# Patient Record
Sex: Male | Born: 1935 | Race: White | Hispanic: No | Marital: Married | Smoking: Former smoker
Health system: Southern US, Community
[De-identification: ages and names within clinical notes are randomized; demographics above are authoritative.]

## PROBLEM LIST (undated history)

## (undated) DIAGNOSIS — E785 Hyperlipidemia, unspecified: Secondary | ICD-10-CM

## (undated) DIAGNOSIS — I10 Essential (primary) hypertension: Secondary | ICD-10-CM

## (undated) HISTORY — PX: OTHER SURGICAL HISTORY: SHX169

---

## 2005-07-03 ENCOUNTER — Ambulatory Visit: Payer: Self-pay

## 2012-11-15 ENCOUNTER — Emergency Department (HOSPITAL_COMMUNITY)
Admission: EM | Admit: 2012-11-15 | Discharge: 2012-11-15 | Disposition: A | Discharge status: Home / Self Care | Payer: Medicare Other | Attending: Emergency Medicine | Admitting: Emergency Medicine

## 2012-11-15 ENCOUNTER — Encounter (HOSPITAL_COMMUNITY): Payer: Self-pay

## 2012-11-15 DIAGNOSIS — H538 Other visual disturbances: Secondary | ICD-10-CM | POA: Insufficient documentation

## 2012-11-15 DIAGNOSIS — R630 Anorexia: Secondary | ICD-10-CM | POA: Insufficient documentation

## 2012-11-15 DIAGNOSIS — Z789 Other specified health status: Secondary | ICD-10-CM

## 2012-11-15 DIAGNOSIS — R29898 Other symptoms and signs involving the musculoskeletal system: Secondary | ICD-10-CM | POA: Insufficient documentation

## 2012-11-15 DIAGNOSIS — Z79899 Other long term (current) drug therapy: Secondary | ICD-10-CM | POA: Insufficient documentation

## 2012-11-15 DIAGNOSIS — R5381 Other malaise: Secondary | ICD-10-CM | POA: Insufficient documentation

## 2012-11-15 DIAGNOSIS — H409 Unspecified glaucoma: Secondary | ICD-10-CM | POA: Insufficient documentation

## 2012-11-15 DIAGNOSIS — Z7409 Other reduced mobility: Secondary | ICD-10-CM

## 2012-11-15 DIAGNOSIS — R000 Tachycardia, unspecified: Secondary | ICD-10-CM | POA: Insufficient documentation

## 2012-11-15 DIAGNOSIS — R0682 Tachypnea, not elsewhere classified: Secondary | ICD-10-CM | POA: Insufficient documentation

## 2012-11-15 DIAGNOSIS — M25579 Pain in unspecified ankle and joints of unspecified foot: Secondary | ICD-10-CM | POA: Insufficient documentation

## 2012-11-15 DIAGNOSIS — N390 Urinary tract infection, site not specified: Secondary | ICD-10-CM | POA: Insufficient documentation

## 2012-11-15 LAB — URINALYSIS, ROUTINE W REFLEX MICROSCOPIC
Bilirubin Urine: NEGATIVE
Glucose, UA: NEGATIVE mg/dL
Hgb urine dipstick: NEGATIVE
Nitrite: NEGATIVE
Specific Gravity, Urine: 1.015 (ref 1.005–1.030)
Urobilinogen, UA: 0.2 mg/dL (ref 0.0–1.0)
pH: 6 (ref 5.0–8.0)

## 2012-11-15 LAB — MAGNESIUM: Magnesium: 1.9 mg/dL (ref 1.5–2.5)

## 2012-11-15 LAB — COMPREHENSIVE METABOLIC PANEL
ALT: 47 U/L (ref 0–53)
AST: 40 U/L — ABNORMAL HIGH (ref 0–37)
Albumin: 3.3 g/dL — ABNORMAL LOW (ref 3.5–5.2)
Alkaline Phosphatase: 102 U/L (ref 39–117)
BUN: 19 mg/dL (ref 6–23)
CO2: 24 mEq/L (ref 19–32)
Calcium: 10 mg/dL (ref 8.4–10.5)
Chloride: 91 mEq/L — ABNORMAL LOW (ref 96–112)
Creatinine, Ser: 1.01 mg/dL (ref 0.50–1.35)
GFR calc Af Amer: 76 mL/min — ABNORMAL LOW (ref >90)
GFR calc non Af Amer: 65 mL/min — ABNORMAL LOW (ref >90)
Glucose, Bld: 154 mg/dL — ABNORMAL HIGH (ref 70–99)
Potassium: 3.8 mEq/L (ref 3.5–5.1)
Sodium: 131 mEq/L — ABNORMAL LOW (ref 135–145)
Total Bilirubin: 0.6 mg/dL (ref 0.3–1.2)
Total Protein: 8.3 g/dL (ref 6.0–8.3)

## 2012-11-15 LAB — CBC WITH DIFFERENTIAL/PLATELET
Basophils Absolute: 0 10*3/uL (ref 0.0–0.1)
Basophils Relative: 1 % (ref 0–1)
Eosinophils Absolute: 0 10*3/uL (ref 0.0–0.7)
Eosinophils Relative: 0 % (ref 0–5)
HCT: 36.5 % — ABNORMAL LOW (ref 39.0–52.0)
Hemoglobin: 12.3 g/dL — ABNORMAL LOW (ref 13.0–17.0)
Lymphocytes Relative: 21 % (ref 12–46)
Lymphs Abs: 1.3 10*3/uL (ref 0.7–4.0)
MCH: 28.3 pg (ref 26.0–34.0)
MCHC: 33.7 g/dL (ref 30.0–36.0)
MCV: 83.9 fL (ref 78.0–100.0)
Monocytes Absolute: 0.5 10*3/uL (ref 0.1–1.0)
Monocytes Relative: 7 % (ref 3–12)
Neutro Abs: 4.5 10*3/uL (ref 1.7–7.7)
Neutrophils Relative %: 72 % (ref 43–77)
Platelets: 199 10*3/uL (ref 150–400)
RBC: 4.35 MIL/uL (ref 4.22–5.81)
RDW: 13.6 % (ref 11.5–15.5)
WBC: 6.3 10*3/uL (ref 4.0–10.5)

## 2012-11-15 LAB — URINE MICROSCOPIC-ADD ON

## 2012-11-15 LAB — TROPONIN I: Troponin I: <0.3 ng/mL (ref <0.30)

## 2012-11-15 MED ORDER — CEPHALEXIN 500 MG PO CAPS: 500.0000 mg | ORAL_CAPSULE | Freq: Four times a day (QID) | ORAL | Status: DC

## 2012-11-15 MED ORDER — DEXTROSE 5 % IV SOLN
1.0000 g | Freq: Once | INTRAVENOUS | Status: AC
  Administered 2012-11-15: 1 g via INTRAVENOUS
  Filled 2012-11-15: qty 10

## 2012-11-15 MED ORDER — SODIUM CHLORIDE 0.9 % IV BOLUS (SEPSIS)
1000.0000 mL | Freq: Once | INTRAVENOUS | Status: AC
  Administered 2012-11-15: 1000 mL via INTRAVENOUS

## 2012-11-15 NOTE — ED Notes (Signed)
[facility] to take pt home, fu with [employer] [facility] [place] [phone]

## 2012-11-15 NOTE — ED Notes (Signed)
Spoke with Cleotis Nipper from [facility].  Provided contact # for patient discharge:  [phone].  They will arrange to send someone to their home to do welfare check.

## 2012-11-15 NOTE — ED Notes (Signed)
Pt here from [facility] for a check up and either placement or help at home.

## 2012-11-15 NOTE — ED Provider Notes (Signed)
CSN: [id number]     Arrival date & time 11/15/12  1500 History  This chart was scribed for Jorge Razor, MD by Bennett Scrape, ED Scribe. This patient was seen in room APA03/APA03 and the patient's care was started at 4:19 PM.   Chief Complaint  Patient presents with  . multiple complaints     The history is provided by the patient. No language interpreter was used.    HPI Comments: Jorge Wilson is a 79 y.o. male who presents to the Emergency Department with his wife requesting help with placement in a home. Pt states that he had bilateral foot pain described as burning and was seen by [facility] on July 17th, 2014. He was started on the medication Gabapentin which improved the symptoms. However, he stopped taking the medications because of side effects. Since then, pt states that he has not been able to take of himself, has had a decreased appetite, worsening of chronic blurred vision from glaucoma and just feels "run down". He states that he doesn't have any energy to ambulate around his house. Pt states that he lives at home alone with his wife and is her the main caretaker. He states that he does all the cooking, drives and all the yard work but does not feel like he can continue completing all of these tasks at this time. Pt states that he has one son that feels he should find placement as well. He states that he has been seen at [facility] with the same complaints and received no help. He reports that he was having a nurse come daily more than 2 years ago but told her to not "come back" due to her saying he was wasting money. He denies having any other resources or in home checks since then.  PCP is Dr. Andres Shad with the [facility]  History reviewed. No pertinent past medical history. History reviewed. No pertinent past surgical history. No family history on file. History  Substance Use Topics  . Smoking status: Not on file  . Smokeless tobacco: Not on file   . Alcohol Use: Not on file    Review of Systems  Constitutional: Positive for appetite change and unexpected weight change.  Eyes: Positive for visual disturbance.  Respiratory: Negative for shortness of breath.   Cardiovascular: Negative for chest pain.  Gastrointestinal: Negative for abdominal pain.  Neurological: Positive for weakness.  All other systems reviewed and are negative.    Allergies  Review of patient's allergies indicates not on file.  Home Medications  No current outpatient prescriptions on file.  Triage Vitals: BP 127/72  Pulse 117  Temp(Src) 98.3 F (36.8 C) (Oral)  Resp 18  SpO2 100%  Physical Exam  Nursing note and vitals reviewed. Constitutional: He is oriented to person, place, and time. No distress.  frail and chronically ill appearing, but not distressed  HENT:  Head: Normocephalic and atraumatic.  Eyes: EOM are normal.  Neck: Neck supple. No tracheal deviation present.  Cardiovascular: Regular rhythm.   No murmur heard. tachycardic  Pulmonary/Chest: Breath sounds normal. No respiratory distress.  Tachypneic, speaking in complete sentences  Abdominal: Soft. There is no tenderness.  Musculoskeletal: Normal range of motion.  Neurological: He is alert and oriented to person, place, and time. No cranial nerve deficit. He exhibits normal muscle tone. Coordination normal.  Skin: Skin is warm and dry.  Psychiatric: He has a normal mood and affect. His behavior is normal.    ED  Course   DIAGNOSTIC STUDIES: Oxygen Saturation is 100% on room air, normal by my interpretation.    COORDINATION OF CARE: 4:25 PM-Advised pt that placement might not be achieved today. Discussed treatment plan which includes CBC panel, CMP and UA with pt at bedside and pt agreed to plan. Will have social work speak with the pt.   Procedures (including critical care time)  Labs Reviewed  CBC WITH DIFFERENTIAL - Abnormal; Notable for the following:    Hemoglobin 12.3  (*)    HCT 36.5 (*)    All other components within normal limits  COMPREHENSIVE METABOLIC PANEL - Abnormal; Notable for the following:    Sodium 131 (*)    Chloride 91 (*)    Glucose, Bld 154 (*)    Albumin 3.3 (*)    AST 40 (*)    GFR calc non Af Amer 65 (*)    GFR calc Af Amer 76 (*)    All other components within normal limits  URINALYSIS, ROUTINE W REFLEX MICROSCOPIC - Abnormal; Notable for the following:    Ketones, ur TRACE (*)    Protein, ur TRACE (*)    Leukocytes, UA TRACE (*)    All other components within normal limits  URINE MICROSCOPIC-ADD ON - Abnormal; Notable for the following:    Squamous Epithelial / LPF FEW (*)    Bacteria, UA MANY (*)    All other components within normal limits  URINE CULTURE  MAGNESIUM  TROPONIN I   No results found. 1. UTI (urinary tract infection)   2. Impaired mobility and ADLs     MDM  88ym with generalized fatigue. Pt and wife who also presented are in need of additional assistance. They have a son but it sounds like he has parkinson's and unfortunately unable to provide much assistance. W/u today fairly unremarkable aside from possible UTI. Given symptoms, will tx. Pt afebrile and nontoxic. Pt and wife have no ride home. May potentially be able to have neighbor provide them a ride. Care management spoke with pt/wife and APS to be involved as well as home nursing assessment arranged.   I personally preformed the services scribed in my presence. The recorded information has been reviewed is accurate. Jorge Razor, MD.    Jorge Razor, MD 11/21/12 [phone]

## 2012-11-15 NOTE — ED Notes (Signed)
Pt alert & oriented x4, stable gait. Patient given discharge instructions, paperwork & prescription(s). Patient  instructed to stop at the registration desk to finish any additional paperwork. Patient verbalized understanding. Pt left department w/ no further questions. 

## 2012-11-15 NOTE — Progress Notes (Signed)
Spoke with pt and spouse prior to MD seeing. They have come to ED due to feeling unable to continue at home without being " checked out" because they are not eating or drinking well and  Think they may be dehydrated, and run down. They are just not doing well trying to care for each other, but do not really want to go into a home, if we at the hospital can "do something for them". Will refer to CSW if  Needed after MD sees pt 

## 2012-11-15 NOTE — ED Notes (Signed)
Pt very poor historian 

## 2012-11-17 LAB — URINE CULTURE
Colony Count: NO GROWTH
Culture: NO GROWTH

## 2012-11-25 ENCOUNTER — Ambulatory Visit: Payer: Self-pay | Admitting: Internal Medicine

## 2012-11-26 ENCOUNTER — Inpatient Hospital Stay: Payer: Self-pay | Admitting: Internal Medicine

## 2012-11-26 LAB — URINALYSIS, COMPLETE
Bilirubin,UR: NEGATIVE
Protein: 100
Specific Gravity: 1.021 (ref 1.003–1.030)
Squamous Epithelial: 1
WBC UR: 58 /HPF (ref 0–5)

## 2012-11-26 LAB — COMPREHENSIVE METABOLIC PANEL
Alkaline Phosphatase: 101 U/L (ref 50–136)
Anion Gap: 5 — ABNORMAL LOW (ref 7–16)
Chloride: 100 mmol/L (ref 98–107)
Co2: 28 mmol/L (ref 21–32)
Glucose: 130 mg/dL — ABNORMAL HIGH (ref 65–99)
Osmolality: 269 (ref 275–301)
SGOT(AST): 31 U/L (ref 15–37)
SGPT (ALT): 52 U/L (ref 12–78)

## 2012-11-26 LAB — APTT: Activated PTT: 32.9 secs (ref 23.6–35.9)

## 2012-11-26 LAB — CBC
MCH: 27.9 pg (ref 26.0–34.0)
MCHC: 33.6 g/dL (ref 32.0–36.0)
Platelet: 241 10*3/uL (ref 150–440)
WBC: 12.7 10*3/uL — ABNORMAL HIGH (ref 3.8–10.6)

## 2012-11-26 LAB — CALCIUM: Calcium, Total: 8.3 mg/dL — ABNORMAL LOW (ref 8.5–10.1)

## 2012-11-27 LAB — CBC WITH DIFFERENTIAL/PLATELET
Eosinophil %: 0.1 %
HCT: 29.7 % — ABNORMAL LOW (ref 40.0–52.0)
HGB: 10.1 g/dL — ABNORMAL LOW (ref 13.0–18.0)
Lymphocyte #: 1 10*3/uL (ref 1.0–3.6)
MCHC: 34 g/dL (ref 32.0–36.0)
Monocyte #: 0.6 x10 3/mm (ref 0.2–1.0)
Neutrophil #: 7.2 10*3/uL — ABNORMAL HIGH (ref 1.4–6.5)
Platelet: 223 10*3/uL (ref 150–440)
RBC: 3.6 10*6/uL — ABNORMAL LOW (ref 4.40–5.90)
RDW: 15.6 % — ABNORMAL HIGH (ref 11.5–14.5)

## 2012-11-27 LAB — BASIC METABOLIC PANEL
Anion Gap: 5 — ABNORMAL LOW (ref 7–16)
BUN: 12 mg/dL (ref 7–18)
Calcium, Total: 7.9 mg/dL — ABNORMAL LOW (ref 8.5–10.1)
Creatinine: 0.79 mg/dL (ref 0.60–1.30)
EGFR (African American): >60
EGFR (Non-African Amer.): >60
Glucose: 117 mg/dL — ABNORMAL HIGH (ref 65–99)
Sodium: 134 mmol/L — ABNORMAL LOW (ref 136–145)

## 2012-11-27 LAB — MAGNESIUM: Magnesium: 2.1 mg/dL

## 2012-11-27 LAB — TSH: Thyroid Stimulating Horm: 0.628 u[IU]/mL

## 2012-11-29 LAB — BASIC METABOLIC PANEL
Anion Gap: 5 — ABNORMAL LOW (ref 7–16)
Chloride: 105 mmol/L (ref 98–107)
Creatinine: 0.74 mg/dL (ref 0.60–1.30)
EGFR (African American): >60

## 2012-11-29 LAB — URINE CULTURE

## 2012-11-30 LAB — CBC WITH DIFFERENTIAL/PLATELET
Basophil %: 0.5 %
Eosinophil #: 0 10*3/uL (ref 0.0–0.7)
HCT: 28.5 % — ABNORMAL LOW (ref 40.0–52.0)
HGB: 9.7 g/dL — ABNORMAL LOW (ref 13.0–18.0)
Lymphocyte %: 25.6 %
MCH: 28.1 pg (ref 26.0–34.0)
MCV: 82 fL (ref 80–100)
Monocyte #: 0.5 x10 3/mm (ref 0.2–1.0)
Monocyte %: 8.3 %
Neutrophil #: 4 10*3/uL (ref 1.4–6.5)
Neutrophil %: 65.6 %
Platelet: 239 10*3/uL (ref 150–440)
RDW: 15.2 % — ABNORMAL HIGH (ref 11.5–14.5)

## 2012-12-01 LAB — CULTURE, BLOOD (SINGLE)

## 2012-12-04 LAB — CULTURE, BLOOD (SINGLE)

## 2012-12-25 ENCOUNTER — Ambulatory Visit: Payer: Self-pay | Admitting: Internal Medicine

## 2014-07-17 NOTE — Discharge Summary (Signed)
PATIENT NAME:  Jorge Wilson, Jorge Wilson MR#:  [id number] DATE OF BIRTH:  30-Mar-1923  DATE OF ADMISSION:  11/26/2012 DATE OF DISCHARGE:  12/02/2012  DISCHARGE DIAGNOSES: 1.  Generalized weakness and failure to thrive, likely due to urinary tract infection, with possible underlying deconditioning, started on Marinol and encouraging p.o. intake. 2.   Pseudomonas and enterococcal urinary tract infection, improving on antibiotics.  3. Weakness due to urinary tract infection and known pneumonia.  4.  Depression, on mirtazapine started while here at bedtime.  5.  Hypoxia, could be due to atelectasis and not taking deep breaths.   SECONDARY DIAGNOSES: 1.  Hypertension.  2.  Hypothyroidism.  3.  Benign prostatic hypertrophy.  4.  Glaucoma.   CONSULTATIONS: Physical therapy.   PROCEDURES/RADIOLOGY: Chest x-ray on the 6th of September showed no acute cardiopulmonary disease.   CT scan of the head without contrast on 6th of September showed atrophy with chronic microvascular ischemic disease. No acute intracranial abnormality.   Chest x-ray on 2nd of September showed interstitial pneumonitis, versus interstitial edema.   CT scan of the abdomen and pelvis without contrast on 2nd of September showed possible BPH with abnormal appearance of prostate, showing prostate enlargement; cannot rule out prostate cancer or prostatitis. Atherosclerotic calcification. Pericardial thickening or pericardial fluid. Small calcification along the wall of gallbladder in 2 foci.   MAJOR LABORATORY PANEL: UA on admission showed trace bacteria, 58 WBCs,  2+ leukocyte esterase.  Blood cultures x 2 were negative on 2nd of September. Urine culture grew more than 100,000 colonies of pseudomonas and more 100,000 colonies of Enterococcus faecalis.   Blood cultures x 2 were negative on 5th of September.   HISTORY AND SHORT HOSPITAL COURSE: The patient is an 81 year old male with above-mentioned medical problems who was admitted  for SIRS, thought to be secondary to a UTI.    The patient was also found to have possible BPH and was found to be very weak and had failure to thrive, for which she was started on Marinol to improve oral intake. His urine culture grew pseudomonas and enterococcus and is being switched over to Zosyn and is being switched over to Levaquin. Based on culture sensitivity he is slowly improving. He was evaluated by physical therapy and was recommended rehab, where he is being discharged in stable condition. He does remain at very high risk for re-hospitalization.   Pertinent physical examination on the date of discharge:   CARDIOVASCULAR: S1, S2 normal.  VITAL SIGNS: On the date of discharge temperature 98.8, heart rate 88 per minute, respirations 19 per minute, blood pressure 143/75. He is saturating 96% on 2 liters oxygen via nasal cannula.  CARDIOVASCULAR: S1, S2 normal. No murmurs, rubs, or gallop.  LUNGS: Clear to auscultation bilaterally. No wheezing, rales, rhonchi or crepitation.  ABDOMEN: Soft, benign.  NEUROLOGIC: Nonfocal examination.   All other physical examination remained at baseline.   DISCHARGE MEDICATIONS: 1.  Doxazosin 2 mg p.o. daily.  2. Iron sulfate 325 mg p.o. daily.  3.  Levothyroxine 25 mcg p.o. daily.  4.  Atorvastatin 20 mg p.o. at bedtime.  5.  Humalog eye drops 1 drop to each eye once daily.  6.  Travatan 0.004% ophthalmic solution to each affected eye once daily.  7.  Mirtazapine 7.5 mg p.o. at bedtime.  8.  Marinol 2.5 mg p.o. b.i.d.  9.  Coreg 3.125 mg p.o. b.i.d.  10.  Levaquin 250 mg p.o. daily for 4 more days.  11.  Ensure 240 mg  p.o. 4 times a day.  12.  Flomax 0.4 mg p.o. daily.   DISCHARGE DIET: Low-sodium.   DISCHARGE ACTIVITY: As tolerated.   DISCHARGE INSTRUCTIONS AND FOLLOWUP: The patient was instructed to follow up with his physician at [facility] in 1 to 2 weeks.   Total time discharging this patient: Forty-five minutes.   He will get  physical therapy management by outpatient physical therapy. He will likely require  2 liters oxygen by nasal cannula.    ____________________________ Zariyah Stephens S. Sherryll Burger, MD vss:dm D: 12/02/2012 13:40:48 ET T: 12/02/2012 14:21:53 ET JOB#: [id number]  cc: Brysten Reister S. Sherryll Burger, MD, <Dictator> [facility] [place] Leanna Sato, MD [place] [facility] MD ELECTRONICALLY SIGNED 12/06/2012 23:31

## 2014-07-17 NOTE — H&P (Signed)
PATIENT NAME:  Jorge Wilson, Jorge Wilson MR#:  [id number] DATE OF BIRTH:  Oct 27, 1923  DATE OF Sondra BargesDMISSION:  11/26/2012  PRIMARY CARE PHYSICIAN[facility]  [facility], Dr. Marvis MoellerMiles.   CHIEF COMPLAINT: "I feel run down, and I have no appetite."   HISTORY OF PRESENT ILLNESS: This is an 81 year old man who is a poor historian. He is coming in feeling run down and no appetite. He was found to have an elevated heart rate and elevated white count, positive urinalysis and a pneumonitis on chest x-ray. Hospitalist services were contacted for further evaluation.   PAST MEDICAL HISTORY: Hypertension, hypothyroidism, BPH and glaucoma.   PAST SURGICAL HISTORY: The patient denies.   ALLERGIES: ASPIRIN.   MEDICATIONS: The patient states that he takes a blood pressure pill, a thyroid pill, a urine pill and drops for glaucoma. We will need to get that list.   SOCIAL HISTORY: No smoking. No alcohol. No drug use. 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 Lives with his wife.   FAMILY HISTORY: Mother died of old age at age 386. Father killed himself and he had cancer, unknown type.   REVIEW OF SYSTEMS:  CONSTITUTIONAL: Positive for weight loss 40 pounds in about 5 months. No fever, chills or sweats. Positive for dry mouth. Positive for fatigue.  EYES: He does wear glasses.  EARS, NOSE, MOUTH AND THROAT: Decreased hearing. Positive for runny nose. No sore throat. No difficulty swallowing.  CARDIOVASCULAR: No chest pain. No palpitations.  RESPIRATORY: Positive for shortness of breath. No cough. No sputum. No hemoptysis.  GASTROINTESTINAL: Positive for constipation. No nausea. No vomiting. No abdominal pain. No diarrhea. No bright red blood per rectum. No melena.  GENITOURINARY: No burning on urination. No hematuria.  MUSCULOSKELETAL: No joint pain or muscle pain.  INTEGUMENT: No rashes or eruptions.  NEUROLOGIC: No fainting or blackouts.  PSYCHIATRIC: No anxiety or depression.  ENDOCRINE: Positive for thyroid problems.   HEMATOLOGIC AND LYMPHATICS: No anemia.   PHYSICAL EXAMINATION: VITAL SIGNS: On presentation included a temperature of 98.9, pulse 120, respirations 18, blood pressure 102/55, pulse ox 97% on 2 liters of oxygen.  GENERAL: No respiratory distress, lying flat in bed.  EYES: Conjunctivae and lids normal. Pupils equal, round and reactive to light. Extraocular muscles intact. No nystagmus.  EARS, NOSE, MOUTH AND THROAT: Tympanic membranes: No erythema. Nasal mucosa: No erythema. Throat: No erythema. No exudate seen. Lips and gums: No lesions.  NECK: No JVD. No bruits. No lymphadenopathy. No thyromegaly. No thyroid nodules palpated.  RESPIRATORY: Lungs clear to auscultation. No use of accessory muscles to breathe. No rhonchi, rales or wheeze heard.  CARDIOVASCULAR: S1, S2 normal. No gallops, rubs or murmurs heard. Carotid upstroke 2+ bilaterally. No bruits.  EXTREMITIES: Dorsalis pedis pulses 2+. The patient is tachycardic.  ABDOMEN: Soft, nontender. No organo- or splenomegaly. Normoactive bowel sounds. No masses felt.  LYMPHATIC: No lymph nodes in the neck.  MUSCULOSKELETAL: No clubbing, edema or cyanosis.  SKIN: No ulcers seen.  NEUROLOGIC: Cranial nerves II through XII grossly intact. Deep tendon reflexes 2+ bilateral lower extremity. Babinski negative.  PSYCHIATRIC: The patient is alert, oriented to person, place and time.   LABORATORY AND RADIOLOGICAL DATA: Troponin negative. Magnesium 1.7, lipase 168. White blood cell count 12.7, H and H 11.3 and 33.7, platelet count of 241. Glucose 130, BUN 15, creatinine 0.98, sodium 133, potassium 3.9, chloride 100, CO2 of 28, total bilirubin 0.5. LIVER FUNCTION TESTS: Albumin low at 2.4. Other liver function tests normal. Calcium reported separately is 8.3. Lactic  acid 1.2. CHEST X-RAY: Bilateral diffuse interstitial thickening, likely representing interstitial edema versus interstitial pneumonitis.  URINALYSIS: 2+ leukocyte esterase, 1+ blood. CT scan of the  abdomen and pelvis showed abnormal appearance of the prostate with enlargement, BPH versus prostate cancer are in the differential. Urinary bladder wall is thickened. Ill-defined increased density surrounding the bladder. Correlate for cystitis. Calcification along the gallbladder. Atherosclerotic calcification.   ASSESSMENT AND PLAN: 1.  Systemic inflammatory response syndrome with tachycardia and leukocytosis. Urinalysis suggestive of urinary tract infection. Chest x-ray suggestive of possible pneumonitis. The patient was started on Rocephin and Zithromax. We will also DuoNeb nebulizer solution. We will give blood cultures and urine culture and continue to monitor course.  2.  Hypertension. Blood pressure on the lower side. We will hold blood pressure medication at this time and give IV fluids.  3.  Hypothyroidism.  We will check a TSH. Need to get records of his medication list and start his usual thyroid replacement.  4.  Benign prostatic hypertrophy. We will start Flomax.  5.  Hypomagnesemia. We will replace IV and recheck in the a.m.    TIME SPENT ON ADMISSION: 50 minutes.   CODE STATUS: The patient is a FULL CODE.    We will also get physical therapy evaluation.    ____________________________ Herschell Dimes. Renae Gloss, MD rjw:dmm D: 11/26/2012 20:46:14 ET T: 11/26/2012 21:32:57 ET JOB#: [id number]  cc: Herschell Dimes. Renae Gloss, MD, <Dictator> Leanna Sato, MD Salley Scarlet MD ELECTRONICALLY SIGNED 11/30/2012 14:24

## 2014-08-20 IMAGING — CR DG CHEST 1V PORT
1 series · 1 of 1 positions shown · non-contrast
Comparison: none

REASON FOR EXAM: shortness of breath, fever.  Tachycardia.
COMMENTS:

[ap]
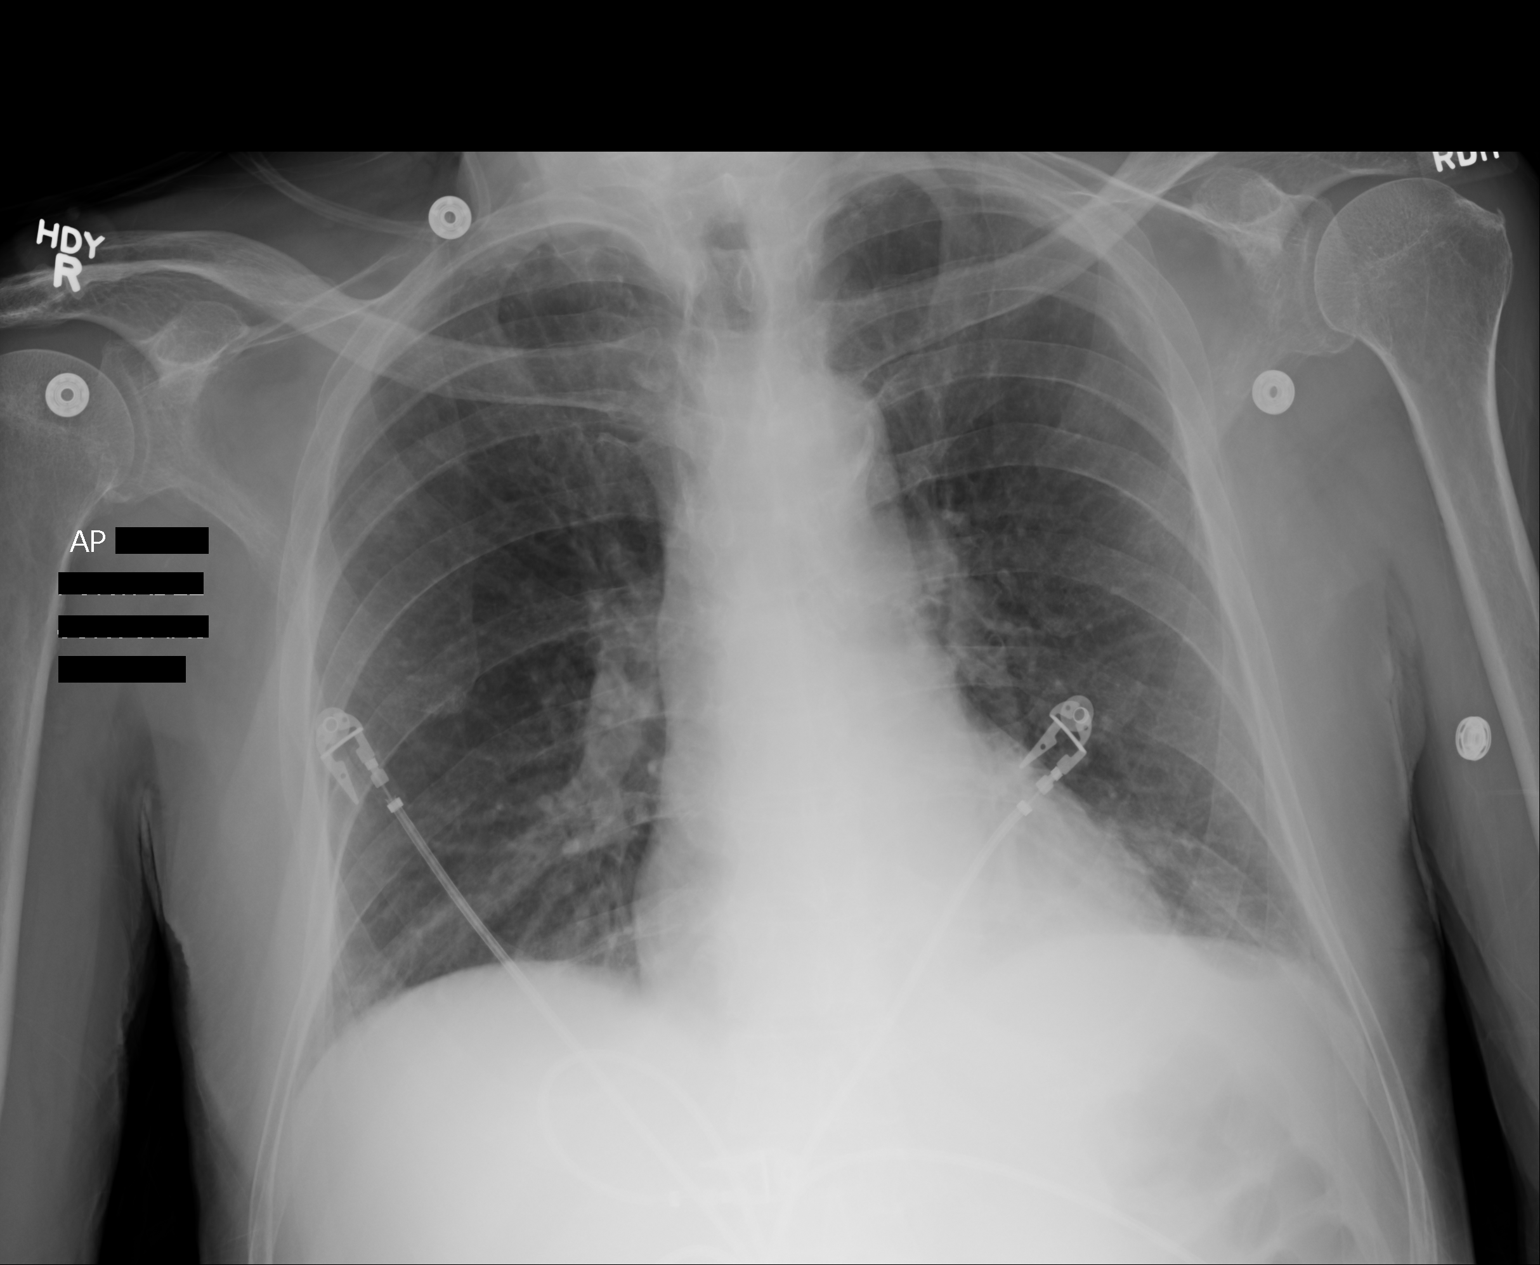

[1 of 1 positions shown; findings below may reference images not displayed]

PROCEDURE:     DXR - DXR PORTABLE CHEST SINGLE VIEW  - November 30, 2012 [DATE]

RESULT:     Cardiac monitoring electrodes are present. The lungs are clear.
The heart and pulmonary vessels are normal. The bony and mediastinal
structures are unremarkable. There is no effusion. There is no pneumothorax
or evidence of congestive failure.
IMPRESSION: No acute cardiopulmonary disease. stable appearance
compared to 11/26/2012

[REDACTED]

## 2014-08-20 IMAGING — CT CT HEAD WITHOUT CONTRAST
1 series · 16 of 28 positions shown, 20 images · non-contrast
Comparison: none

REASON FOR EXAM: Left Sided weakness, AMS.
COMMENTS:

[Series 2: soft tissue · axial · 0.40mm/px · z∈[-124,+1]mm · 16 of 28 slices shown, 20 images]
[im 2/28  brain]
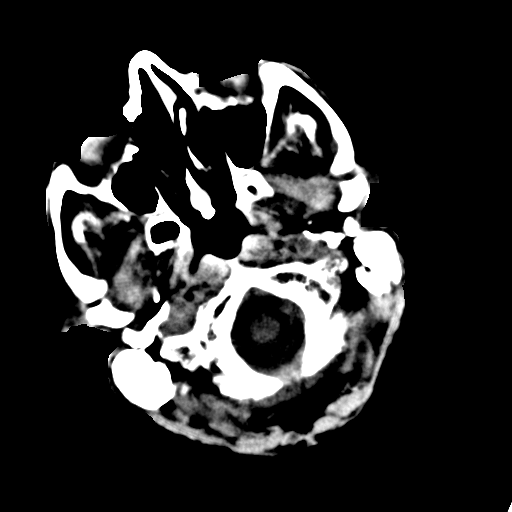
[im 2/28  bone]
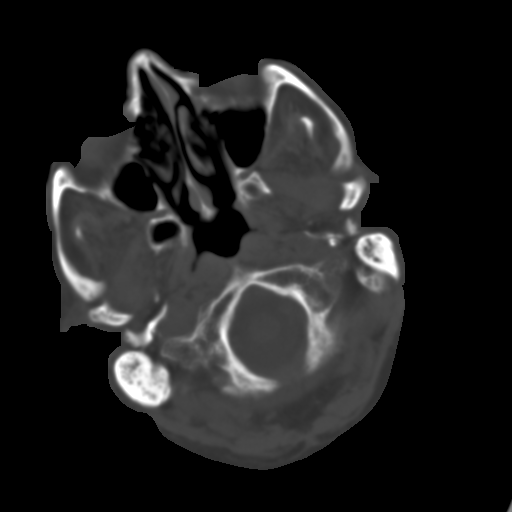
[im 4/28  brain]
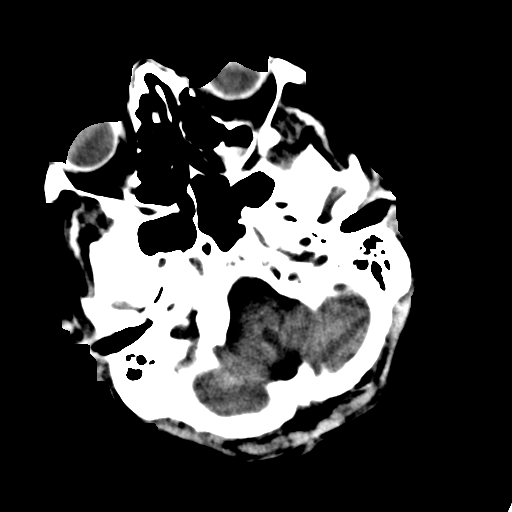
[im 6/28  brain]
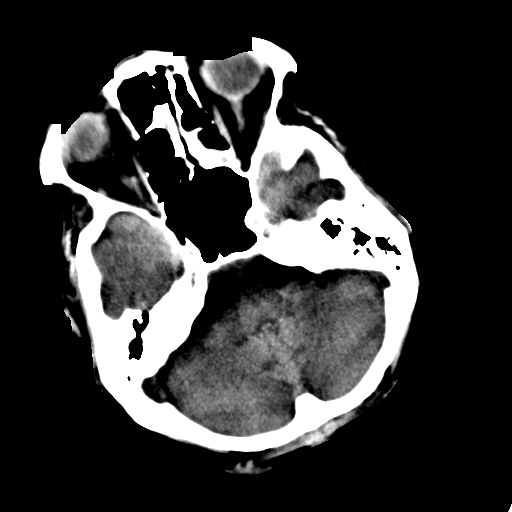
[im 7/28  brain]
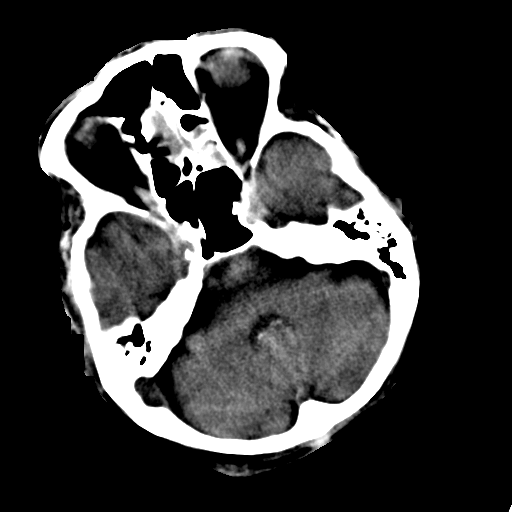
[im 9/28  brain]
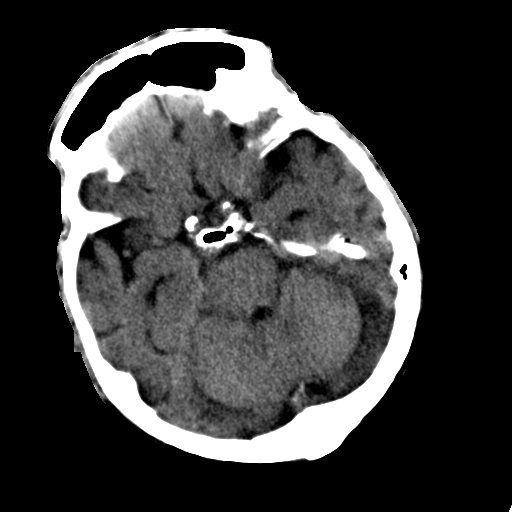
[im 9/28  bone]
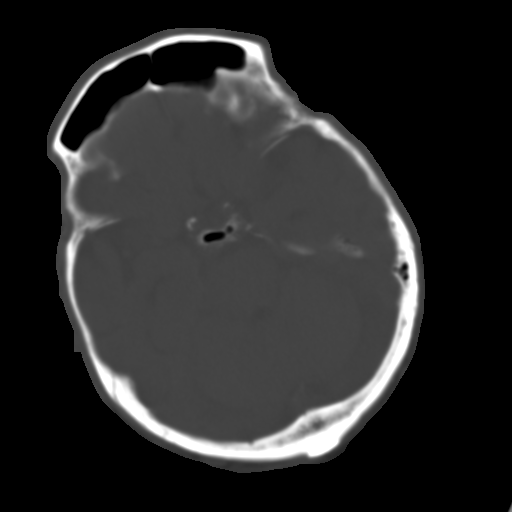
[im 10/28  brain]
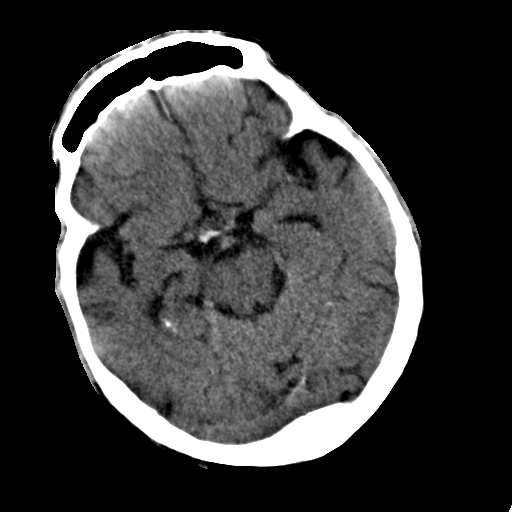
[im 12/28  brain]
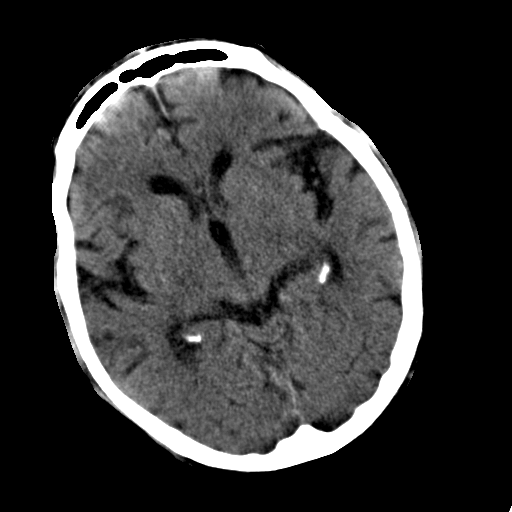
[im 14/28  brain]
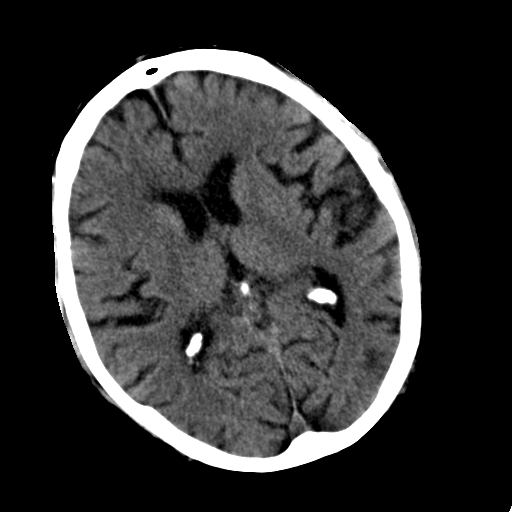
[im 15/28  brain]
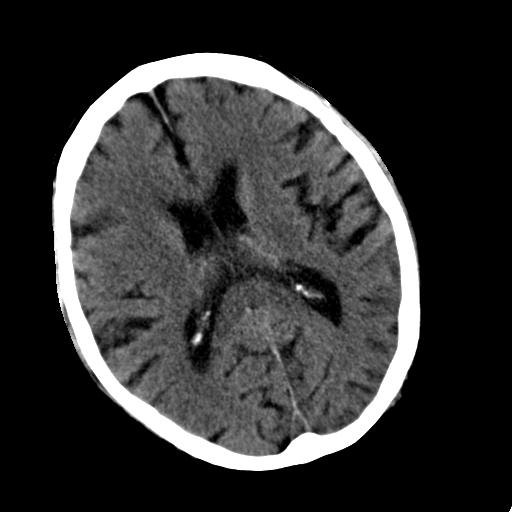
[im 15/28  bone]
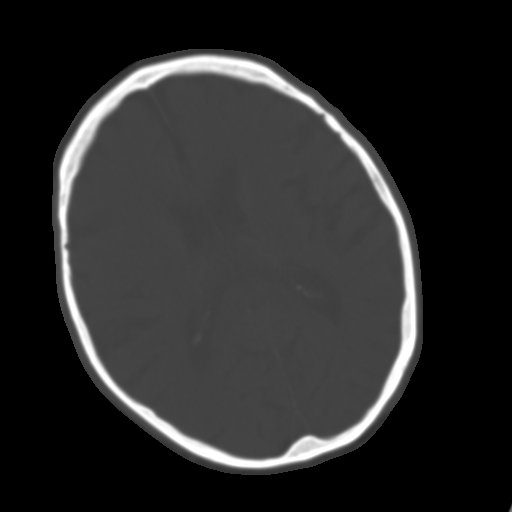
[im 17/28  brain]
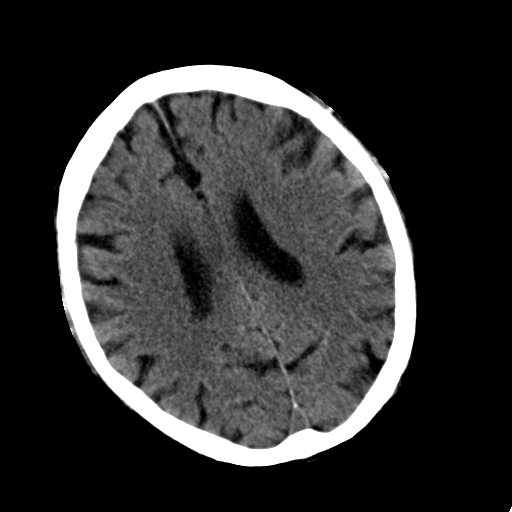
[im 19/28  brain]
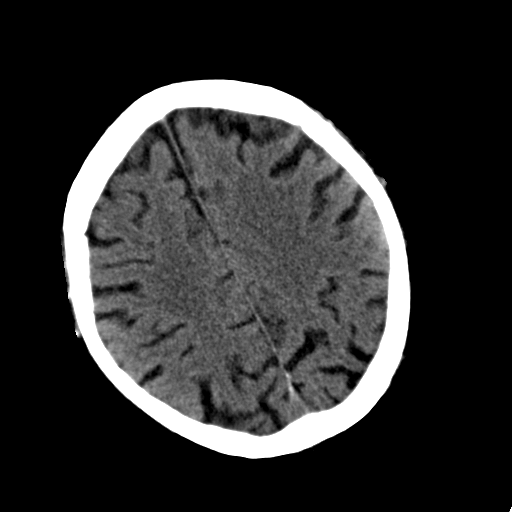
[im 20/28  brain]
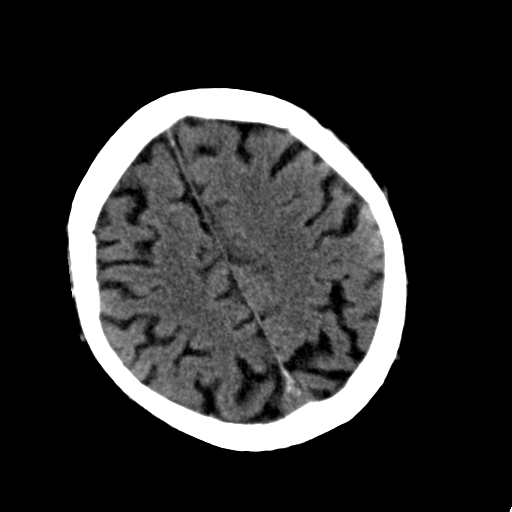
[im 22/28  brain]
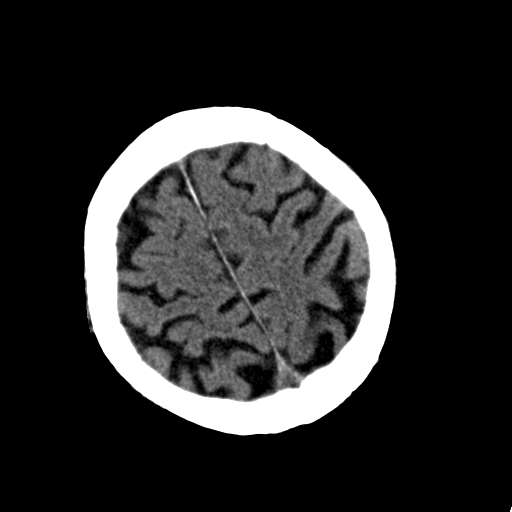
[im 22/28  bone]
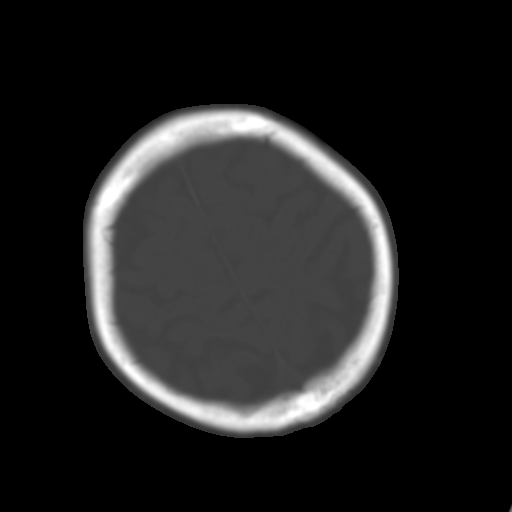
[im 23/28  brain]
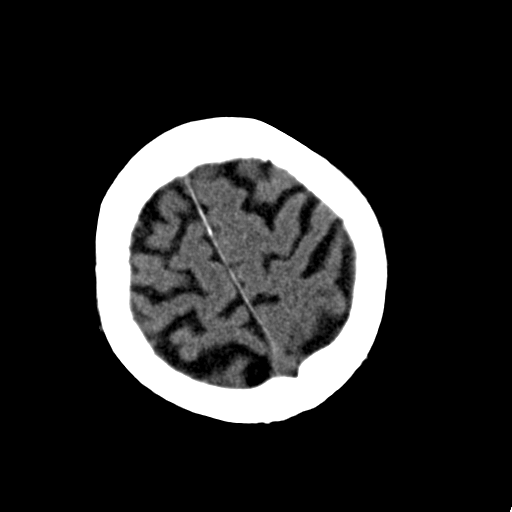
[im 25/28  brain]
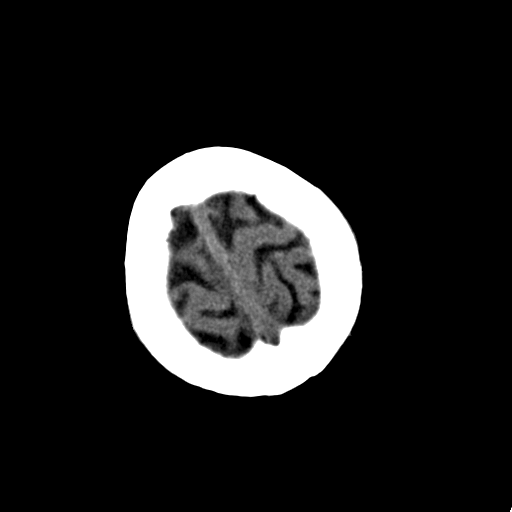
[im 27/28  brain]
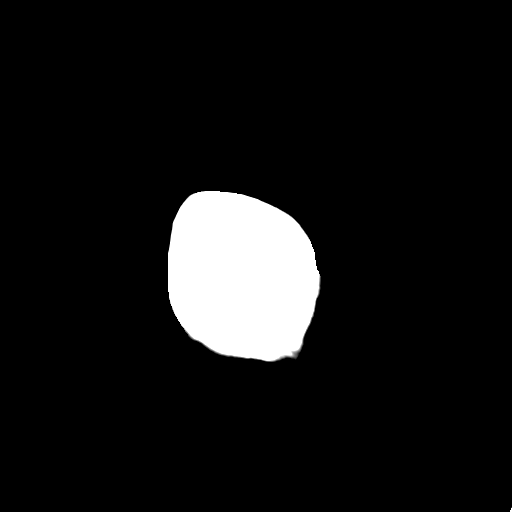

[16 of 28 positions shown; findings below may reference images not displayed]

PROCEDURE:     CT  - CT HEAD WITHOUT CONTRAST  - November 30, 2012 [DATE]

RESULT:     There is prominence of the ventricles and sulci. Low-attenuation
is seen diffusely in the periventricular and subcortical white matter. There
is no intracranial hemorrhage, mass, mass effect or midline shift. There is
no territorial infarct. The sinuses and mastoid air cells show normal
appearing aeration. The calvarium is intact.
IMPRESSION: 1. Atrophy with chronic microvascular ischemic disease. No acute
intracranial abnormality.

[REDACTED]

## 2016-05-10 ENCOUNTER — Inpatient Hospital Stay
Admission: EM | Admit: 2016-05-10 | Discharge: 2016-05-12 | DRG: 280 | Disposition: A | Discharge status: Home Health | Payer: Medicare Other | Attending: Internal Medicine | Admitting: Internal Medicine

## 2016-05-10 ENCOUNTER — Inpatient Hospital Stay: Admit: 2016-05-10 | Payer: Medicare Other

## 2016-05-10 ENCOUNTER — Inpatient Hospital Stay
Admit: 2016-05-10 | Discharge: 2016-05-10 | Disposition: A | Discharge status: Home / Self Care | Payer: Medicare Other | Attending: Internal Medicine | Admitting: Internal Medicine

## 2016-05-10 ENCOUNTER — Encounter
Admission: EM | Disposition: A | Discharge status: Home Health | Payer: Self-pay | Source: Home / Self Care | Attending: Internal Medicine

## 2016-05-10 ENCOUNTER — Emergency Department: Payer: Medicare Other

## 2016-05-10 ENCOUNTER — Encounter: Payer: Self-pay | Admitting: Emergency Medicine

## 2016-05-10 DIAGNOSIS — H409 Unspecified glaucoma: Secondary | ICD-10-CM | POA: Diagnosis present

## 2016-05-10 DIAGNOSIS — Z515 Encounter for palliative care: Secondary | ICD-10-CM | POA: Diagnosis not present

## 2016-05-10 DIAGNOSIS — J101 Influenza due to other identified influenza virus with other respiratory manifestations: Secondary | ICD-10-CM | POA: Diagnosis present

## 2016-05-10 DIAGNOSIS — M6281 Muscle weakness (generalized): Secondary | ICD-10-CM

## 2016-05-10 DIAGNOSIS — J810 Acute pulmonary edema: Secondary | ICD-10-CM | POA: Diagnosis present

## 2016-05-10 DIAGNOSIS — E785 Hyperlipidemia, unspecified: Secondary | ICD-10-CM | POA: Diagnosis present

## 2016-05-10 DIAGNOSIS — Z87891 Personal history of nicotine dependence: Secondary | ICD-10-CM | POA: Diagnosis not present

## 2016-05-10 DIAGNOSIS — Z7189 Other specified counseling: Secondary | ICD-10-CM | POA: Diagnosis not present

## 2016-05-10 DIAGNOSIS — E039 Hypothyroidism, unspecified: Secondary | ICD-10-CM | POA: Diagnosis present

## 2016-05-10 DIAGNOSIS — F329 Major depressive disorder, single episode, unspecified: Secondary | ICD-10-CM | POA: Diagnosis present

## 2016-05-10 DIAGNOSIS — Z66 Do not resuscitate: Secondary | ICD-10-CM

## 2016-05-10 DIAGNOSIS — K219 Gastro-esophageal reflux disease without esophagitis: Secondary | ICD-10-CM | POA: Diagnosis present

## 2016-05-10 DIAGNOSIS — Z886 Allergy status to analgesic agent status: Secondary | ICD-10-CM | POA: Diagnosis not present

## 2016-05-10 DIAGNOSIS — N400 Enlarged prostate without lower urinary tract symptoms: Secondary | ICD-10-CM | POA: Diagnosis present

## 2016-05-10 DIAGNOSIS — I10 Essential (primary) hypertension: Secondary | ICD-10-CM | POA: Diagnosis present

## 2016-05-10 DIAGNOSIS — I259 Chronic ischemic heart disease, unspecified: Secondary | ICD-10-CM | POA: Diagnosis present

## 2016-05-10 DIAGNOSIS — R0602 Shortness of breath: Secondary | ICD-10-CM | POA: Diagnosis present

## 2016-05-10 DIAGNOSIS — I214 Non-ST elevation (NSTEMI) myocardial infarction: Secondary | ICD-10-CM | POA: Diagnosis present

## 2016-05-10 DIAGNOSIS — Z79899 Other long term (current) drug therapy: Secondary | ICD-10-CM

## 2016-05-10 DIAGNOSIS — J40 Bronchitis, not specified as acute or chronic: Secondary | ICD-10-CM

## 2016-05-10 HISTORY — DX: Hyperlipidemia, unspecified: E78.5

## 2016-05-10 HISTORY — DX: Essential (primary) hypertension: I10

## 2016-05-10 HISTORY — PX: LEFT HEART CATH AND CORONARY ANGIOGRAPHY: CATH118249

## 2016-05-10 LAB — CBC
HCT: 35 % — ABNORMAL LOW (ref 40.0–52.0)
HEMATOCRIT: 38.4 % — AB (ref 40.0–52.0)
HEMOGLOBIN: 13.3 g/dL (ref 13.0–18.0)
HEMOGLOBIN: 12.3 g/dL — AB (ref 13.0–18.0)
MCH: 29.3 pg (ref 26.0–34.0)
MCH: 29.9 pg (ref 26.0–34.0)
MCHC: 34.6 g/dL (ref 32.0–36.0)
MCHC: 35.2 g/dL (ref 32.0–36.0)
MCV: 84.8 fL (ref 80.0–100.0)
MCV: 85.1 fL (ref 80.0–100.0)
Platelets: 178 10*3/uL (ref 150–440)
Platelets: 182 10*3/uL (ref 150–440)
RBC: 4.53 MIL/uL (ref 4.40–5.90)
RBC: 4.1 MIL/uL — ABNORMAL LOW (ref 4.40–5.90)
RDW: 14.6 % — ABNORMAL HIGH (ref 11.5–14.5)
RDW: 14.8 % — ABNORMAL HIGH (ref 11.5–14.5)
WBC: 8.3 10*3/uL (ref 3.8–10.6)
WBC: 7.2 10*3/uL (ref 3.8–10.6)

## 2016-05-10 LAB — TROPONIN I
TROPONIN I: 14.59 ng/mL — AB (ref <0.03)
Troponin I: 12.09 ng/mL (ref <0.03)
Troponin I: 10.84 ng/mL (ref <0.03)
Troponin I: 10.28 ng/mL (ref <0.03)
Troponin I: 8.11 ng/mL (ref <0.03)

## 2016-05-10 LAB — BLOOD GAS, VENOUS
ACID-BASE EXCESS: 1.8 mmol/L (ref 0.0–2.0)
BICARBONATE: 27.2 mmol/L (ref 20.0–28.0)
O2 SAT: 60.9 %
PATIENT TEMPERATURE: 37
pCO2, Ven: 45 mmHg (ref 44.0–60.0)
pH, Ven: 7.39 (ref 7.250–7.430)
pO2, Ven: 32 mmHg (ref 32.0–45.0)

## 2016-05-10 LAB — BASIC METABOLIC PANEL
Anion gap: 11 (ref 5–15)
Anion gap: 8 (ref 5–15)
BUN: 16 mg/dL (ref 6–20)
BUN: 19 mg/dL (ref 6–20)
CHLORIDE: 104 mmol/L (ref 101–111)
CHLORIDE: 106 mmol/L (ref 101–111)
CO2: 26 mmol/L (ref 22–32)
CO2: 26 mmol/L (ref 22–32)
CREATININE: 1.11 mg/dL (ref 0.61–1.24)
CREATININE: 1.09 mg/dL (ref 0.61–1.24)
Calcium: 8.9 mg/dL (ref 8.9–10.3)
Calcium: 8.4 mg/dL — ABNORMAL LOW (ref 8.9–10.3)
GFR calc Af Amer: >60 mL/min (ref >60)
GFR calc Af Amer: >60 mL/min (ref >60)
GFR calc non Af Amer: 56 mL/min — ABNORMAL LOW (ref >60)
GFR, EST NON AFRICAN AMERICAN: 58 mL/min — AB (ref >60)
GLUCOSE: 131 mg/dL — AB (ref 65–99)
GLUCOSE: 170 mg/dL — AB (ref 65–99)
POTASSIUM: 3.5 mmol/L (ref 3.5–5.1)
Potassium: 3.3 mmol/L — ABNORMAL LOW (ref 3.5–5.1)
SODIUM: 140 mmol/L (ref 135–145)
Sodium: 141 mmol/L (ref 135–145)

## 2016-05-10 LAB — APTT: aPTT: 35 seconds (ref 24–36)

## 2016-05-10 LAB — PROTIME-INR
INR: 1.04
INR: 1.07
PROTHROMBIN TIME: 13.6 s (ref 11.4–15.2)
Prothrombin Time: 13.9 seconds (ref 11.4–15.2)

## 2016-05-10 LAB — TSH: TSH: 1.393 u[IU]/mL (ref 0.350–4.500)

## 2016-05-10 LAB — INFLUENZA PANEL BY PCR (TYPE A & B)
INFLAPCR: POSITIVE — AB
Influenza B By PCR: NEGATIVE

## 2016-05-10 LAB — HEPARIN LEVEL (UNFRACTIONATED): HEPARIN UNFRACTIONATED: 0.23 [IU]/mL — AB (ref 0.30–0.70)

## 2016-05-10 LAB — BRAIN NATRIURETIC PEPTIDE: B Natriuretic Peptide: 369 pg/mL — ABNORMAL HIGH (ref 0.0–100.0)

## 2016-05-10 SURGERY — LEFT HEART CATH AND CORONARY ANGIOGRAPHY
Anesthesia: Moderate Sedation | Laterality: Right

## 2016-05-10 MED ORDER — LEVOTHYROXINE SODIUM 50 MCG PO TABS
50.0000 ug | ORAL_TABLET | Freq: Every day | ORAL | Status: DC
  Administered 2016-05-11 – 2016-05-12 (×2): 50 ug via ORAL
  Filled 2016-05-10 (×2): qty 1

## 2016-05-10 MED ORDER — SODIUM CHLORIDE 0.9% FLUSH: 3.0000 mL | INTRAVENOUS | Status: DC | PRN

## 2016-05-10 MED ORDER — POTASSIUM CHLORIDE CRYS ER 20 MEQ PO TBCR
40.0000 meq | EXTENDED_RELEASE_TABLET | Freq: Once | ORAL | Status: AC
  Administered 2016-05-10: 40 meq via ORAL
  Filled 2016-05-10: qty 2

## 2016-05-10 MED ORDER — FUROSEMIDE 10 MG/ML IJ SOLN
40.0000 mg | Freq: Once | INTRAMUSCULAR | Status: AC
Start: 2016-05-10 — End: 2016-05-10
  Administered 2016-05-10: 40 mg via INTRAVENOUS
  Filled 2016-05-10: qty 4

## 2016-05-10 MED ORDER — IPRATROPIUM-ALBUTEROL 0.5-2.5 (3) MG/3ML IN SOLN
3.0000 mL | Freq: Once | RESPIRATORY_TRACT | Status: AC
  Administered 2016-05-10: 3 mL via RESPIRATORY_TRACT
  Filled 2016-05-10: qty 3

## 2016-05-10 MED ORDER — DORZOLAMIDE HCL-TIMOLOL MAL 2-0.5 % OP SOLN
1.0000 [drp] | Freq: Two times a day (BID) | OPHTHALMIC | Status: DC
  Filled 2016-05-10 (×2): qty 10

## 2016-05-10 MED ORDER — SODIUM CHLORIDE 0.9 % IV SOLN: 250.0000 mL | INTRAVENOUS | Status: DC | PRN

## 2016-05-10 MED ORDER — ACETAMINOPHEN 650 MG RE SUPP: 650.0000 mg | Freq: Four times a day (QID) | RECTAL | Status: DC | PRN

## 2016-05-10 MED ORDER — SODIUM CHLORIDE 0.9% FLUSH
3.0000 mL | Freq: Two times a day (BID) | INTRAVENOUS | Status: DC
  Administered 2016-05-10 – 2016-05-12 (×3): 3 mL via INTRAVENOUS

## 2016-05-10 MED ORDER — HEPARIN (PORCINE) IN NACL 2-0.9 UNIT/ML-% IJ SOLN
INTRAMUSCULAR | Status: AC
  Filled 2016-05-10: qty 500

## 2016-05-10 MED ORDER — OSELTAMIVIR PHOSPHATE 30 MG PO CAPS
30.0000 mg | ORAL_CAPSULE | Freq: Two times a day (BID) | ORAL | Status: DC
  Administered 2016-05-10 – 2016-05-12 (×5): 30 mg via ORAL
  Filled 2016-05-10 (×5): qty 1

## 2016-05-10 MED ORDER — ATORVASTATIN CALCIUM 20 MG PO TABS
40.0000 mg | ORAL_TABLET | Freq: Every day | ORAL | Status: DC
Start: 2016-05-10 — End: 2016-05-12
  Administered 2016-05-10 – 2016-05-11 (×2): 40 mg via ORAL
  Filled 2016-05-10 (×2): qty 2

## 2016-05-10 MED ORDER — AMLODIPINE BESYLATE 10 MG PO TABS
10.0000 mg | ORAL_TABLET | Freq: Every day | ORAL | Status: DC
  Administered 2016-05-10 – 2016-05-12 (×3): 10 mg via ORAL
  Filled 2016-05-10 (×3): qty 1

## 2016-05-10 MED ORDER — CARVEDILOL 3.125 MG PO TABS
3.1250 mg | ORAL_TABLET | Freq: Two times a day (BID) | ORAL | Status: DC
  Administered 2016-05-10 – 2016-05-12 (×5): 3.125 mg via ORAL
  Filled 2016-05-10 (×6): qty 1

## 2016-05-10 MED ORDER — METHYLPREDNISOLONE SODIUM SUCC 125 MG IJ SOLR
125.0000 mg | Freq: Once | INTRAMUSCULAR | Status: AC
  Administered 2016-05-10: 125 mg via INTRAVENOUS
  Filled 2016-05-10: qty 2

## 2016-05-10 MED ORDER — CHLORHEXIDINE GLUCONATE 0.12 % MT SOLN
15.0000 mL | Freq: Two times a day (BID) | OROMUCOSAL | Status: DC
  Administered 2016-05-10 – 2016-05-12 (×5): 15 mL via OROMUCOSAL
  Filled 2016-05-10 (×5): qty 15

## 2016-05-10 MED ORDER — SODIUM CHLORIDE 0.9 % IV SOLN
INTRAVENOUS | Status: DC
  Administered 2016-05-10: 13:00:00 via INTRAVENOUS

## 2016-05-10 MED ORDER — LATANOPROST 0.005 % OP SOLN
1.0000 [drp] | Freq: Every day | OPHTHALMIC | Status: DC
  Administered 2016-05-10 – 2016-05-11 (×2): 1 [drp] via OPHTHALMIC
  Filled 2016-05-10: qty 2.5

## 2016-05-10 MED ORDER — SODIUM CHLORIDE 0.9% FLUSH: 3.0000 mL | Freq: Two times a day (BID) | INTRAVENOUS | Status: DC

## 2016-05-10 MED ORDER — ONDANSETRON HCL 4 MG/2ML IJ SOLN: 4.0000 mg | Freq: Four times a day (QID) | INTRAMUSCULAR | Status: DC | PRN

## 2016-05-10 MED ORDER — ONDANSETRON HCL 4 MG PO TABS: 4.0000 mg | ORAL_TABLET | Freq: Four times a day (QID) | ORAL | Status: DC | PRN

## 2016-05-10 MED ORDER — BRIMONIDINE TARTRATE 0.2 % OP SOLN
1.0000 [drp] | Freq: Two times a day (BID) | OPHTHALMIC | Status: DC
  Administered 2016-05-10 – 2016-05-12 (×5): 1 [drp] via OPHTHALMIC
  Filled 2016-05-10: qty 5

## 2016-05-10 MED ORDER — SODIUM CHLORIDE 0.9 % WEIGHT BASED INFUSION
1.0000 mL/kg/h | INTRAVENOUS | Status: AC
  Administered 2016-05-10: 1 mL/kg/h via INTRAVENOUS

## 2016-05-10 MED ORDER — FENTANYL CITRATE (PF) 100 MCG/2ML IJ SOLN
INTRAMUSCULAR | Status: DC | PRN
  Administered 2016-05-10: 25 ug via INTRAVENOUS

## 2016-05-10 MED ORDER — SODIUM CHLORIDE 0.9 % WEIGHT BASED INFUSION: 1.0000 mL/kg/h | INTRAVENOUS | Status: DC

## 2016-05-10 MED ORDER — ACETAMINOPHEN 325 MG PO TABS: 650.0000 mg | ORAL_TABLET | Freq: Four times a day (QID) | ORAL | Status: DC | PRN

## 2016-05-10 MED ORDER — IOPAMIDOL (ISOVUE-300) INJECTION 61%
INTRAVENOUS | Status: DC | PRN
  Administered 2016-05-10: 80 mL via INTRA_ARTERIAL

## 2016-05-10 MED ORDER — ATORVASTATIN CALCIUM 20 MG PO TABS: 20.0000 mg | ORAL_TABLET | Freq: Every day | ORAL | Status: DC

## 2016-05-10 MED ORDER — ACETAMINOPHEN 325 MG PO TABS
650.0000 mg | ORAL_TABLET | ORAL | Status: DC | PRN
Start: 2016-05-10 — End: 2016-05-12

## 2016-05-10 MED ORDER — PANTOPRAZOLE SODIUM 40 MG PO TBEC
40.0000 mg | DELAYED_RELEASE_TABLET | Freq: Every day | ORAL | Status: DC
  Administered 2016-05-10 – 2016-05-12 (×3): 40 mg via ORAL
  Filled 2016-05-10 (×3): qty 1

## 2016-05-10 MED ORDER — LEVOTHYROXINE SODIUM 50 MCG PO TABS
50.0000 ug | ORAL_TABLET | Freq: Every day | ORAL | Status: DC
  Administered 2016-05-10: 50 ug via ORAL
  Filled 2016-05-10: qty 1

## 2016-05-10 MED ORDER — BENZONATATE 100 MG PO CAPS
100.0000 mg | ORAL_CAPSULE | Freq: Three times a day (TID) | ORAL | Status: DC
  Administered 2016-05-10 – 2016-05-12 (×6): 100 mg via ORAL
  Filled 2016-05-10 (×6): qty 1

## 2016-05-10 MED ORDER — HEPARIN BOLUS VIA INFUSION
4000.0000 [IU] | Freq: Once | INTRAVENOUS | Status: AC
  Administered 2016-05-10: 4000 [IU] via INTRAVENOUS
  Filled 2016-05-10: qty 4000

## 2016-05-10 MED ORDER — HEPARIN (PORCINE) IN NACL 100-0.45 UNIT/ML-% IJ SOLN
1050.0000 [IU]/h | INTRAMUSCULAR | Status: DC
  Administered 2016-05-10: 950 [IU]/h via INTRAVENOUS
  Filled 2016-05-10 (×2): qty 250

## 2016-05-10 MED ORDER — TAMSULOSIN HCL 0.4 MG PO CAPS
0.4000 mg | ORAL_CAPSULE | Freq: Every day | ORAL | Status: DC
  Administered 2016-05-10 – 2016-05-12 (×3): 0.4 mg via ORAL
  Filled 2016-05-10 (×3): qty 1

## 2016-05-10 MED ORDER — ORAL CARE MOUTH RINSE
15.0000 mL | Freq: Two times a day (BID) | OROMUCOSAL | Status: DC
  Administered 2016-05-10 – 2016-05-11 (×4): 15 mL via OROMUCOSAL

## 2016-05-10 MED ORDER — DORZOLAMIDE HCL-TIMOLOL MAL PF 22.3-6.8 MG/ML OP SOLN
1.0000 [drp] | Freq: Two times a day (BID) | OPHTHALMIC | Status: DC
  Administered 2016-05-10 – 2016-05-12 (×5): 1 [drp] via OPHTHALMIC
  Filled 2016-05-10 (×6): qty 1

## 2016-05-10 MED ORDER — FENTANYL CITRATE (PF) 100 MCG/2ML IJ SOLN
INTRAMUSCULAR | Status: AC
  Filled 2016-05-10: qty 2

## 2016-05-10 MED ORDER — MIRTAZAPINE 15 MG PO TABS
15.0000 mg | ORAL_TABLET | Freq: Every day | ORAL | Status: DC
  Administered 2016-05-10 – 2016-05-11 (×2): 15 mg via ORAL
  Filled 2016-05-10 (×2): qty 1

## 2016-05-10 MED ORDER — SODIUM CHLORIDE 0.9% FLUSH
3.0000 mL | Freq: Two times a day (BID) | INTRAVENOUS | Status: DC
  Administered 2016-05-11 – 2016-05-12 (×4): 3 mL via INTRAVENOUS

## 2016-05-10 MED ORDER — DOCUSATE SODIUM 100 MG PO CAPS
100.0000 mg | ORAL_CAPSULE | Freq: Two times a day (BID) | ORAL | Status: DC
  Administered 2016-05-10 – 2016-05-12 (×3): 100 mg via ORAL
  Filled 2016-05-10 (×5): qty 1

## 2016-05-10 MED ORDER — ASPIRIN 81 MG PO CHEW: 81.0000 mg | CHEWABLE_TABLET | ORAL | Status: DC

## 2016-05-10 MED ORDER — DOXAZOSIN MESYLATE 4 MG PO TABS
2.0000 mg | ORAL_TABLET | Freq: Every day | ORAL | Status: DC
  Administered 2016-05-10 – 2016-05-11 (×2): 2 mg via ORAL
  Filled 2016-05-10 (×2): qty 1

## 2016-05-10 MED ORDER — MIDAZOLAM HCL 2 MG/2ML IJ SOLN
INTRAMUSCULAR | Status: DC | PRN
  Administered 2016-05-10: 0.5 mg via INTRAVENOUS

## 2016-05-10 MED ORDER — MIDAZOLAM HCL 2 MG/2ML IJ SOLN
INTRAMUSCULAR | Status: AC
  Filled 2016-05-10: qty 2

## 2016-05-10 MED ORDER — SODIUM CHLORIDE 0.9 % WEIGHT BASED INFUSION: 3.0000 mL/kg/h | INTRAVENOUS | Status: DC

## 2016-05-10 SURGICAL SUPPLY — 9 items
CATH 5FR JL4 DIAGNOSTIC (CATHETERS) ×3 IMPLANT
CATH 5FR PIGTAIL DIAGNOSTIC (CATHETERS) ×2 IMPLANT
CATH INFINITI JR4 5F (CATHETERS) ×3 IMPLANT
DEVICE CLOSURE MYNXGRIP 5F (Vascular Products) ×3 IMPLANT
KIT MANI 3VAL PERCEP (MISCELLANEOUS) ×3 IMPLANT
NEEDLE PERC 18GX7CM (NEEDLE) ×3 IMPLANT
PACK CARDIAC CATH (CUSTOM PROCEDURE TRAY) ×3 IMPLANT
SHEATH PINNACLE 5F 10CM (SHEATH) ×3 IMPLANT
WIRE EMERALD 3MM-J .035X150CM (WIRE) ×3 IMPLANT

## 2016-05-10 NOTE — ED Provider Notes (Signed)
[facility] Emergency Department Provider Note   ____________________________________________   First MD Initiated Contact with Patient 05/10/16 [phone]     (approximate)  I have reviewed the triage vital signs and the nursing notes.   HISTORY  Chief Complaint Shortness of Breath    HPI Jorge Wilson is a 83 y.o. male who comes into the hospital today with breathing problems. He reports that he has been having increased difficulties over the last few days. The patient has been having some burning in his chest as well. He thought he had the flu. He's had a cough but nonproductive and he has not taken anything for the cough and he has not taken anything for the breathing. He woke up at 11:30 with some nausea. He reports that he had fever yesterday but did not take his temperature. He denies any sick contacts. He also denies pain right now but reports that it is bleeding that bothers him more now but he does occasionally have this mid burning chest pain. The patient has not had any sweats or any other complaints. He is here for evaluation.   Past Medical History:  Diagnosis Date  . Hyperlipidemia   . Hypertension     There are no active problems to display for this patient.   History reviewed. No pertinent surgical history.  Prior to Admission medications   Medication Sig Start Date End Date Taking? Authorizing Provider  amLODipine (NORVASC) 10 MG tablet Take 10 mg by mouth daily.   Yes Historical Provider, MD  atorvastatin (LIPITOR) 20 MG tablet Take 20 mg by mouth at bedtime.   Yes Historical Provider, MD  brimonidine (ALPHAGAN) 0.2 % ophthalmic solution Place 1 drop into both eyes 2 (two) times daily.   Yes Historical Provider, MD  carvedilol (COREG) 3.125 MG tablet Take 3.125 mg by mouth 2 (two) times daily.   Yes Historical Provider, MD  dorzolamide-timolol (COSOPT) 22.3-6.8 MG/ML ophthalmic solution Place 1 drop into both eyes 2 (two) times daily.    Yes Historical Provider, MD  doxazosin (CARDURA) 2 MG tablet Take 2 mg by mouth at bedtime.   Yes Historical Provider, MD  latanoprost (XALATAN) 0.005 % ophthalmic solution Place 1 drop into both eyes at bedtime.   Yes Historical Provider, MD  levothyroxine (SYNTHROID, LEVOTHROID) 50 MCG tablet Take 50 mcg by mouth daily.   Yes Historical Provider, MD  mirtazapine (REMERON) 15 MG tablet Take 15 mg by mouth at bedtime.   Yes Historical Provider, MD  omeprazole (PRILOSEC) 20 MG capsule Take 20 mg by mouth daily.   Yes Historical Provider, MD  tamsulosin (FLOMAX) 0.4 MG CAPS capsule Take 0.4 mg by mouth daily.   Yes Historical Provider, MD    Allergies Aspirin  No family history on file.  Social History Social History  Substance Use Topics  . Smoking status: Former Smoker    Quit date: 1988  . Smokeless tobacco: Former Neurosurgeon    Quit date: 1988  . Alcohol use No    Review of Systems Constitutional:  fever/chills Eyes: No visual changes. ENT: No sore throat. Cardiovascular: chest pain. Respiratory: Cough and shortness of breath. Gastrointestinal: No abdominal pain.  No nausea, no vomiting.  No diarrhea.  No constipation. Genitourinary: Negative for dysuria. Musculoskeletal: Negative for back pain. Skin: Negative for rash. Neurological: Negative for headaches, focal weakness or numbness.  10-point ROS otherwise negative.  ____________________________________________   PHYSICAL EXAM:  VITAL SIGNS: ED Triage Vitals [05/10/16 0109]  Enc Vitals  Group     BP (!) 148/74     Pulse Rate 95     Resp 16     Temp 98.2 F (36.8 C)     Temp Source Oral     SpO2 96 %     Weight 172 lb (78 kg)     Height 5\' 7"  (1.702 m)     Head Circumference      Peak Flow      Pain Score      Pain Loc      Pain Edu?      Excl. in GC?     Constitutional: Alert and oriented. Well appearing and in Moderate distress. Eyes: Conjunctivae are normal. PERRL. EOMI. Head: Atraumatic. Nose: No  congestion/rhinnorhea. Mouth/Throat: Mucous membranes are moist.  Oropharynx non-erythematous. Cardiovascular: Normal rate, regular rhythm. Grossly normal heart sounds.  Good peripheral circulation. Respiratory: Increased respiratory effort with abdominal breathing and prolonged expiratory phase. Expiratory wheezes in all lung fields. Gastrointestinal: Soft and nontender. No distention. Positive bowel sounds Musculoskeletal: No lower extremity tenderness nor edema.   Neurologic:  Normal speech and language. No gross focal neurologic deficits are appreciated. No gait instability. Skin:  Skin is warm, dry and intact.  Psychiatric: Mood and affect are normal.   ____________________________________________   LABS (all labs ordered are listed, but only abnormal results are displayed)  Labs Reviewed  CBC - Abnormal; Notable for the following:       Result Value   HCT 38.4 (*)    RDW 14.6 (*)    All other components within normal limits  BASIC METABOLIC PANEL - Abnormal; Notable for the following:    Glucose, Bld 131 (*)    GFR calc non Af Amer 56 (*)    All other components within normal limits  TROPONIN I - Abnormal; Notable for the following:    Troponin I 14.59 (*)    All other components within normal limits  BRAIN NATRIURETIC PEPTIDE - Abnormal; Notable for the following:    B Natriuretic Peptide 369.0 (*)    All other components within normal limits  INFLUENZA PANEL BY PCR (TYPE A & B) - Abnormal; Notable for the following:    Influenza A By PCR POSITIVE (*)    All other components within normal limits  BLOOD GAS, VENOUS   ____________________________________________  EKG  ED ECG REPORT I, Rebecka ApleyWebster,  Naly Schwanz P, the attending physician, personally viewed and interpreted this ECG.   Date: 05/10/2016  EKG Time: 106  Rate: 69  Rhythm: normal sinus rhythm  Axis: normal  Intervals:none  ST&T Change:  none  ____________________________________________  RADIOLOGY  CXR ____________________________________________   PROCEDURES  Procedure(s) performed: None  Procedures  Critical Care performed: Yes, see critical care note(s)  CRITICAL CARE Performed by: Lucrezia EuropeWebster,  Indianna Boran P   Total critical care time: 30 minutes  Critical care time was exclusive of separately billable procedures and treating other patients.  Critical care was necessary to treat or prevent imminent or life-threatening deterioration.  Critical care was time spent personally by me on the following activities: development of treatment plan with patient and/or surrogate as well as nursing, discussions with consultants, evaluation of patient's response to treatment, examination of patient, obtaining history from patient or surrogate, ordering and performing treatments and interventions, ordering and review of laboratory studies, ordering and review of radiographic studies, pulse oximetry and re-evaluation of patient's condition.  ____________________________________________   INITIAL IMPRESSION / ASSESSMENT AND PLAN / ED COURSE  Pertinent labs &  imaging results that were available during my care of the patient were reviewed by me and considered in my medical decision making (see chart for details).  This is a 83 year old male who comes into the hospital today with some shortness of breath. The patient had some wheezing and some prolonged expiratory phase I initially gave him some Solu-Medrol and some DuoNeb. We did check some blood work to include a flu swab as well as a troponin chest x-ray and basic labs. The patient's troponin came back at 14.59. He also was positive for the flu. The patient's chest x-ray was positive for congestive changes with some interstitial edema. I did go back in to reassess the patient and he was still wheezing. I decided to admit the patient. I will give him a dose of heparin and put him on  a drip as well as some Lasix. The patient is allergic to aspirin so I'm unable to give aspirin and he is outside of the window Tamiflu since she's had these symptoms for several days. The patient will be admitted to the hospitalist service.  Clinical Course as of May 10 344  Wed May 10, 2016  0214 Mild congestive changes with slight interstitial edema in the bases. DG Chest Portable 1 View [AW]    Clinical Course User Index [AW] Rebecka Apley, MD     ____________________________________________   FINAL CLINICAL IMPRESSION(S) / ED DIAGNOSES  Final diagnoses:  NSTEMI (non-ST elevated myocardial infarction) (HCC)  Bronchitis  Influenza A  Shortness of breath  Acute pulmonary edema (HCC)      NEW MEDICATIONS STARTED DURING THIS VISIT:  New Prescriptions   No medications on file     Note:  This document was prepared using Dragon voice recognition software and may include unintentional dictation errors.    Rebecka Apley, MD 05/10/16 [phone]

## 2016-05-10 NOTE — Progress Notes (Signed)
Returned from Cardiac Cath.  [facility]  On return from cath patients states he may not want heart surgery.  Assured him that whether to have it or not is fully his decision. Rian, RN, who works with Dr. Donata ClayVan Trigt called to let me know Dr. Donata ClayVan Trigt will evaluate  his films etc this evening and determine if he thinks Jorge Wilson is a candidate. I alerted her to the patient's reluctance at this time.

## 2016-05-10 NOTE — Consult Note (Signed)
Jorge Wilson is a 83 y.o. male  [id number]  Primary Cardiologist: Adrian BlackwaterShaukat Khan Reason for Consultation: NSTEMI, elevated Troponin  HPI: [age] white male with a past medical history of HTN and hyperlipidemia who presented to ED with shortness of breath. His troponin was significantly elevated at 14.59 and was started on heparin drip immediately.    Review of Systems: Denies chest pain and shortness of breath. Remains on oxygen. He reports he is feeling well.    Past Medical History:  Diagnosis Date  . Hyperlipidemia   . Hypertension     Medications Prior to Admission  Medication Sig Dispense Refill  . amLODipine (NORVASC) 10 MG tablet Take 10 mg by mouth daily.    Marland Kitchen. atorvastatin (LIPITOR) 20 MG tablet Take 20 mg by mouth at bedtime.    . brimonidine (ALPHAGAN) 0.2 % ophthalmic solution Place 1 drop into both eyes 2 (two) times daily.    . carvedilol (COREG) 3.125 MG tablet Take 3.125 mg by mouth 2 (two) times daily.    . dorzolamide-timolol (COSOPT) 22.3-6.8 MG/ML ophthalmic solution Place 1 drop into both eyes 2 (two) times daily.    Marland Kitchen. doxazosin (CARDURA) 2 MG tablet Take 2 mg by mouth at bedtime.    Marland Kitchen. latanoprost (XALATAN) 0.005 % ophthalmic solution Place 1 drop into both eyes at bedtime.    Marland Kitchen. levothyroxine (SYNTHROID, LEVOTHROID) 50 MCG tablet Take 50 mcg by mouth daily.    . mirtazapine (REMERON) 15 MG tablet Take 15 mg by mouth at bedtime.    Marland Kitchen. omeprazole (PRILOSEC) 20 MG capsule Take 20 mg by mouth daily.    . tamsulosin (FLOMAX) 0.4 MG CAPS capsule Take 0.4 mg by mouth daily.       Marland Kitchen. amLODipine  10 mg Oral Daily  . atorvastatin  20 mg Oral QHS  . brimonidine  1 drop Both Eyes BID  . carvedilol  3.125 mg Oral BID WC  . chlorhexidine  15 mL Mouth Rinse BID  . docusate sodium  100 mg Oral BID  . Dorzolamide HCl-Timolol Mal PF  1 drop Both Eyes BID  . doxazosin  2 mg Oral QHS  . latanoprost  1 drop Both Eyes QHS  . levothyroxine  50 mcg Oral QAC breakfast  . mouth  rinse  15 mL Mouth Rinse q12n4p  . mirtazapine  15 mg Oral QHS  . oseltamivir  30 mg Oral BID  . pantoprazole  40 mg Oral QAC breakfast  . sodium chloride flush  3 mL Intravenous Q12H  . tamsulosin  0.4 mg Oral Daily    Infusions: . heparin 950 Units/hr (05/10/16 0409)    Allergies  Allergen Reactions  . Aspirin Other (See Comments)    Reaction: bleeding ulcer    Social History   Social History  . Marital status: Married    Spouse name: N/A  . Number of children: N/A  . Years of education: N/A   Occupational History  . Not on file.   Social History Main Topics  . Smoking status: Former Smoker    Quit date: 1988  . Smokeless tobacco: Former NeurosurgeonUser    Quit date: 1988  . Alcohol use No  . Drug use: Unknown  . Sexual activity: Not on file   Other Topics Concern  . Not on file   Social History Narrative  . No narrative on file    Family History  Problem Relation Age of Onset  . Cancer Father  deceased of self-inflicted gunshot wound    PHYSICAL EXAM: Vitals:   05/10/16 0746 05/10/16 1144  BP: 118/61 (!) 116/57  Pulse: 90 86  Resp: 20 18  Temp: 98.4 F (36.9 C) 98.4 F (36.9 C)     Intake/Output Summary (Last 24 hours) at 05/10/16 1154 Last data filed at 05/10/16 0509  Gross per 24 hour  Intake                0 ml  Output              200 ml  Net             -200 ml    General:  Well appearing. No respiratory difficulty HEENT: normal Neck: supple. no JVD. Carotids 2+ bilat; no bruits. No lymphadenopathy or thryomegaly appreciated. Cor: PMI nondisplaced. Regular rate & rhythm. No rubs, gallops or murmurs. Lungs: clear Abdomen: soft, nontender, nondistended. No hepatosplenomegaly. No bruits or masses. Good bowel sounds. Extremities: no cyanosis, clubbing, rash, edema Neuro: alert & oriented x 3, cranial nerves grossly intact. moves all 4 extremities w/o difficulty. Affect pleasant.  ECG: Normal sinus rhythm, 72bpm  Results for orders placed  or performed during the hospital encounter of 05/10/16 (from the past 24 hour(s))  CBC     Status: Abnormal   Collection Time: 05/10/16  1:08 AM  Result Value Ref Range   WBC 8.3 3.8 - 10.6 K/uL   RBC 4.53 4.40 - 5.90 MIL/uL   Hemoglobin 13.3 13.0 - 18.0 g/dL   HCT 16.1 (L) 09.6 - 04.5 %   MCV 84.8 80.0 - 100.0 fL   MCH 29.3 26.0 - 34.0 pg   MCHC 34.6 32.0 - 36.0 g/dL   RDW 40.9 (H) 81.1 - 91.4 %   Platelets 178 150 - 440 K/uL  Basic metabolic panel     Status: Abnormal   Collection Time: 05/10/16  1:08 AM  Result Value Ref Range   Sodium 141 135 - 145 mmol/L   Potassium 3.5 3.5 - 5.1 mmol/L   Chloride 104 101 - 111 mmol/L   CO2 26 22 - 32 mmol/L   Glucose, Bld 131 (H) 65 - 99 mg/dL   BUN 16 6 - 20 mg/dL   Creatinine, Ser 7.82 0.61 - 1.24 mg/dL   Calcium 8.9 8.9 - 95.6 mg/dL   GFR calc non Af Amer 56 (L) >60 mL/min   GFR calc Af Amer >60 >60 mL/min   Anion gap 11 5 - 15  Troponin I     Status: Abnormal   Collection Time: 05/10/16  1:08 AM  Result Value Ref Range   Troponin I 14.59 (HH) <0.03 ng/mL  Blood gas, venous     Status: None   Collection Time: 05/10/16  1:08 AM  Result Value Ref Range   pH, Ven 7.39 7.250 - 7.430   pCO2, Ven 45 44.0 - 60.0 mmHg   pO2, Ven 32.0 32.0 - 45.0 mmHg   Bicarbonate 27.2 20.0 - 28.0 mmol/L   Acid-Base Excess 1.8 0.0 - 2.0 mmol/L   O2 Saturation 60.9 %   Patient temperature 37.0    Collection site VEIN    Sample type VENOUS   Brain natriuretic peptide     Status: Abnormal   Collection Time: 05/10/16  1:08 AM  Result Value Ref Range   B Natriuretic Peptide 369.0 (H) 0.0 - 100.0 pg/mL  Influenza panel by PCR (type A & B)     Status: Abnormal  Collection Time: 05/10/16  1:08 AM  Result Value Ref Range   Influenza A By PCR POSITIVE (A) NEGATIVE   Influenza B By PCR NEGATIVE NEGATIVE  APTT     Status: None   Collection Time: 05/10/16  4:10 AM  Result Value Ref Range   aPTT 35 24 - 36 seconds  Protime-INR     Status: None    Collection Time: 05/10/16  4:10 AM  Result Value Ref Range   Prothrombin Time 13.6 11.4 - 15.2 seconds   INR 1.04   Troponin I     Status: Abnormal   Collection Time: 05/10/16  5:44 AM  Result Value Ref Range   Troponin I 12.09 (HH) <0.03 ng/mL  TSH     Status: None   Collection Time: 05/10/16  7:53 AM  Result Value Ref Range   TSH 1.393 0.350 - 4.500 uIU/mL  Troponin I     Status: Abnormal   Collection Time: 05/10/16  7:53 AM  Result Value Ref Range   Troponin I 10.84 (HH) <0.03 ng/mL   Dg Chest Portable 1 View  Result Date: 05/10/2016 CLINICAL DATA:  Shortness of breath, progressively worsening over 2 days. Wheezing. Hypertension. Former smoker. EXAM: PORTABLE CHEST 1 VIEW COMPARISON:  11/30/2012 FINDINGS: Shallow inspiration. Mild cardiac enlargement with mild pulmonary vascular congestion. Suggestion of mild interstitial edema at the bases. No blunting of costophrenic angles. No pneumothorax. Calcified and tortuous aorta. IMPRESSION: Mild congestive changes with slight interstitial edema in the bases. Electronically Signed   By: Burman Nieves M.D.   On: 05/10/2016 02:11     ASSESSMENT AND PLAN: NSTEMI with significantly elevated Troponin. Blood pressure and HR are well controled and pt is resting comfortably. Pt has no history of kidney disease, his creatinine and BUN are well within normal limits. Echo was done, awaiting reading.  Dr. Welton Flakes plans to perform cardiac cath today at 1:00. Cardiac cath procedure explained to pt and he verbalized understanding. He reports he has never had a surgery before.   Caroleen Hamman

## 2016-05-10 NOTE — Progress Notes (Signed)
[facility] [place] [facility]   PATIENT NAME: Jorge Wilson    MR#:  [id number]  DATE OF BIRTH:  03-02-1924  SUBJECTIVE:  CHIEF COMPLAINT:   Chief Complaint  Patient presents with  . Shortness of Breath   - Feels very weak, still has cough. No further chest pain. -Flu is positive. Also elevated troponin and anticipate cardiac catheterization later today  REVIEW OF SYSTEMS:  Review of Systems  Constitutional: Positive for malaise/fatigue. Negative for chills and fever.  HENT: Positive for hearing loss. Negative for congestion, ear discharge and nosebleeds.   Eyes: Negative for blurred vision and double vision.  Respiratory: Positive for cough and shortness of breath. Negative for wheezing.   Cardiovascular: Negative for chest pain, palpitations and leg swelling.  Gastrointestinal: Negative for abdominal pain, constipation, diarrhea, nausea and vomiting.  Genitourinary: Negative for dysuria.  Musculoskeletal: Negative for myalgias.  Neurological: Negative for dizziness, speech change, focal weakness, seizures and headaches.  Psychiatric/Behavioral: Negative for depression.    DRUG ALLERGIES:   Allergies  Allergen Reactions  . Aspirin Other (See Comments)    Reaction: bleeding ulcer    VITALS:  Blood pressure (!) 116/57, pulse 86, temperature 98.4 F (36.9 C), temperature source Oral, resp. rate 18, height 5\' 7"  (1.702 m), weight 73.8 kg (162 lb 11.2 oz), SpO2 93 %.  PHYSICAL EXAMINATION:  Physical Exam  GENERAL:  83 y.o.-year-old patient lying in the bed with no acute distress.  EYES: Pupils equal, round, reactive to light and accommodation. No scleral icterus. Extraocular muscles intact.  HEENT: Head atraumatic, normocephalic. Oropharynx and nasopharynx clear.  NECK:  Supple, no jugular venous distention. No thyroid enlargement, no tenderness.  LUNGS: Scattered rhonchi bilaterally, no wheezing, rales or crepitation. No use of accessory  muscles of respiration.  CARDIOVASCULAR: S1, S2 normal. No rubs, or gallops. 2/6 systolic murmur is present ABDOMEN: Soft, nontender, nondistended. Bowel sounds present. No organomegaly or mass.  EXTREMITIES: No pedal edema, cyanosis, or clubbing.  NEUROLOGIC: Cranial nerves II through XII are intact. Muscle strength 5/5 in all extremities. Sensation intact. Gait not checked. Global Weakness noted PSYCHIATRIC: The patient is alert and oriented x 3.  SKIN: No obvious rash, lesion, or ulcer.    LABORATORY PANEL:   CBC  Recent Labs Lab 05/10/16 0108  WBC 8.3  HGB 13.3  HCT 38.4*  PLT 178   ------------------------------------------------------------------------------------------------------------------  Chemistries   Recent Labs Lab 05/10/16 0108  NA 141  K 3.5  CL 104  CO2 26  GLUCOSE 131*  BUN 16  CREATININE 1.11  CALCIUM 8.9   ------------------------------------------------------------------------------------------------------------------  Cardiac Enzymes  Recent Labs Lab 05/10/16 0753  TROPONINI 10.84*   ------------------------------------------------------------------------------------------------------------------  RADIOLOGY:  Dg Chest Portable 1 View  Result Date: 05/10/2016 CLINICAL DATA:  Shortness of breath, progressively worsening over 2 days. Wheezing. Hypertension. Former smoker. EXAM: PORTABLE CHEST 1 VIEW COMPARISON:  11/30/2012 FINDINGS: Shallow inspiration. Mild cardiac enlargement with mild pulmonary vascular congestion. Suggestion of mild interstitial edema at the bases. No blunting of costophrenic angles. No pneumothorax. Calcified and tortuous aorta. IMPRESSION: Mild congestive changes with slight interstitial edema in the bases. Electronically Signed   By: Burman Nieves M.D.   On: 05/10/2016 02:11    EKG:   Orders placed or performed during the hospital encounter of 05/10/16  . EKG 12-Lead  . EKG 12-Lead    ASSESSMENT AND PLAN:    83 year old male who stays at home with his 5, past medical history of hypertension and hyperlipidemia  presents to the hospital secondary to dyspnea, cough and chest pain.  #1 NSTEMI- troponins are significantly elevated. -Currently chest pain-free. -Continue heparin drip, on coreg and statin - allergic to aspirin - Appreciate cardiology consult. For cardiac catheterization later today, -Echocardiogram is pending  #2 influenza illness-continue Tamiflu. Added cough medications. -Chest x-ray with interstitial edema. One dose of Lasix in emergency room. -Wean O2 as tolerated. Follow-up echocardiogram and add medications if needed.  #3 GERD-on Protonix  #4 depression-on Remeron  #5 hypothyroidism-Synthroid  #6 DVT prophylaxis-on heparin drip-  Physical therapy consult prior to discharge    All the records are reviewed and case discussed with Care Management/Social Workerr. Management plans discussed with the patient, family and they are in agreement.  CODE STATUS: Full code  TOTAL TIME TAKING CARE OF THIS PATIENT: 38 minutes.   POSSIBLE D/C IN 1-2 DAYS, DEPENDING ON CLINICAL CONDITION.   Enid BaasKALISETTI,Elease Swarm M.D on 05/10/2016 at 1:10 PM  Between 7am to 6pm - Pager - [phone]  After 6pm go to [email or web address] - password [email or web address]  [facility]  Office  [phone]  CC: Primary care physician; Pcp Not In System

## 2016-05-10 NOTE — Progress Notes (Signed)
ANTICOAGULATION CONSULT NOTE - Initial Consult  Pharmacy Consult for heparin Indication: chest pain/ACS  Allergies  Allergen Reactions  . Aspirin Other (See Comments)    Reaction: bleeding ulcer    Patient Measurements: Height: 5\' 7"  (170.2 cm) Weight: 162 lb 11.2 oz (73.8 kg) IBW/kg (Calculated) : 66.1 Heparin Dosing Weight: 78 kg  Vital Signs: Temp: 98.1 F (36.7 C) (02/14 1623) Temp Source: Oral (02/14 1623) BP: 115/48 (02/14 1623) Pulse Rate: 72 (02/14 1623)  Labs:  Recent Labs  05/10/16 0108 05/10/16 0410 05/10/16 0544 05/10/16 0753 05/10/16 1227 05/10/16 1310  HGB 13.3  --   --   --   --  12.3*  HCT 38.4*  --   --   --   --  35.0*  PLT 178  --   --   --   --  182  APTT  --  35  --   --   --   --   LABPROT  --  13.6  --   --   --  13.9  INR  --  1.04  --   --   --  1.07  HEPARINUNFRC  --   --   --   --  0.23*  --   CREATININE 1.11  --   --   --   --  1.09  TROPONINI 14.59*  --  12.09* 10.84* 10.28*  --     Estimated Creatinine Clearance: 42.1 mL/min (by C-G formula based on SCr of 1.09 mg/dL).   Medical History: Past Medical History:  Diagnosis Date  . Hyperlipidemia   . Hypertension     Medications:  Infusions:  . sodium chloride 1 mL/kg/hr (05/10/16 1547)  . heparin 950 Units/hr (05/10/16 1611)    Assessment: 89 yom cc SOB with positive troponin in ED. Pharmacy consulted to dose heparin for ACS. No PTA OAC noted.   Goal of Therapy:  Heparin level 0.3-0.7 units/ml Monitor platelets by anticoagulation protocol: Yes   Plan:  Pt went to cath lab around 1300. Drip was resumed around 1600 per RN @ 9.775ml/hr/ Since pt was subtherapeutic 0.23 at that rate, will increase rate to 10.465ml/hr. Recheck in 8 hours  Desyre Calma D Radford Pease, Pharm.D., BCPS Clinical Pharmacist 05/10/2016,5:20 PM

## 2016-05-10 NOTE — ED Notes (Signed)
Pt's oxygen saturation dropped to 89%, pt placed on 2L oxygen via nasal canula

## 2016-05-10 NOTE — ED Triage Notes (Signed)
Pt arrived via ems from home with complaints of shortness of breath that has progressively gotten worse over the last few days. Upon assessment pt wheezing and breathing with the use of accessory muscles. Pt oxygen saturation 95% on room air. EMS reports in route vitals as followed: blood pressure 140/70, heart rate 100, 93% on room air.

## 2016-05-10 NOTE — Plan of Care (Signed)
Problem: Education: Goal: Knowledge of [facility] Education information/materials will improve Outcome: Progressing Oriented patient to room features and unit routines.  Explained to him what an echocardiogram entailed.

## 2016-05-10 NOTE — Clinical Social Work Note (Signed)
CSW [facility] [place] [facility] [phone] who is providing care for patient's wife in her home.  Toni AmendCourtney wanted CSW to find out if patient wanted to temporarily increase care that is being provided for patient's wife.  CSW asked patient and he said his son is at home with patient's wife and can continue to take care of her, and he also wanted [facility] to continue to see her three times a week with no increase in care currently.  CSW contacted Toni AmendCourtney and relayed the message.  CSW to sign off please reconsult if other social work needs arise.  Ervin KnackEric R. Hassan Rowannterhaus, MSW, Theresia MajorsLCSWA [phone]  05/10/2016 3:49 PM

## 2016-05-10 NOTE — Progress Notes (Signed)
ANTICOAGULATION CONSULT NOTE - Initial Consult  Pharmacy Consult for heparin Indication: chest pain/ACS  Allergies  Allergen Reactions  . Aspirin Other (See Comments)    Reaction: bleeding ulcer    Patient Measurements: Height: 5\' 7"  (170.2 cm) Weight: 172 lb (78 kg) IBW/kg (Calculated) : 66.1 Heparin Dosing Weight: 78 kg  Vital Signs: Temp: 98.2 F (36.8 C) (02/14 0109) Temp Source: Oral (02/14 0109) BP: 116/61 (02/14 0230) Pulse Rate: 84 (02/14 0230)  Labs:  Recent Labs  05/10/16 0108  HGB 13.3  HCT 38.4*  PLT 178  CREATININE 1.11  TROPONINI 14.59*    Estimated Creatinine Clearance: 41.4 mL/min (by C-G formula based on SCr of 1.11 mg/dL).   Medical History: Past Medical History:  Diagnosis Date  . Hyperlipidemia   . Hypertension     Medications:  Infusions:  . heparin      Assessment: 89 yom cc SOB with positive troponin in ED. Pharmacy consulted to dose heparin for ACS. No PTA OAC noted.   Goal of Therapy:  Heparin level 0.3-0.7 units/ml Monitor platelets by anticoagulation protocol: Yes   Plan:  Give 4000 units bolus x 1 Start heparin infusion at 950 units/hr Check anti-Xa level in 8 hours and daily while on heparin Continue to monitor H&H and platelets  Carola FrostNathan A Cire Clute, Pharm.D., BCPS Clinical Pharmacist 05/10/2016,3:49 AM

## 2016-05-10 NOTE — H&P (Addendum)
Jorge Wilson is an 83 y.o. male.   Chief Complaint: Shortness of breath HPI: The patient with past medical history of hypertension and hyperlipidemia presents to the emergency department due to shortness of breath. The patient states that his dyspnea lasted for hours and began at rest. Occasionally he would have "a burning feeling" in his chest. The pain did not radiate. He also admits to a cough for the last few days. Eventually was unable to get comfortable and came to the emergency department for evaluation. He was found to have a significantly elevated troponin at 14.5 for which he was promptly started on a heparin drip. Chest x-ray also showed pulmonary edema. He was given Lasix IV as well as breathing treatments and Solu-Medrol prior to the emergency department staff called the hospitalist service for admission.  Past Medical History:  Diagnosis Date  . Hyperlipidemia   . Hypertension     Past Surgical History:  Procedure Laterality Date  . none      Family History  Problem Relation Age of Onset  . Cancer Father     deceased of self-inflicted gunshot wound   Social History:  reports that he quit smoking about 30 years ago. He quit smokeless tobacco use about 30 years ago. He reports that he does not drink alcohol. His drug history is not on file.  Allergies:  Allergies  Allergen Reactions  . Aspirin Other (See Comments)    Reaction: bleeding ulcer    Medications Prior to Admission  Medication Sig Dispense Refill  . amLODipine (NORVASC) 10 MG tablet Take 10 mg by mouth daily.    Marland Kitchen atorvastatin (LIPITOR) 20 MG tablet Take 20 mg by mouth at bedtime.    . brimonidine (ALPHAGAN) 0.2 % ophthalmic solution Place 1 drop into both eyes 2 (two) times daily.    . carvedilol (COREG) 3.125 MG tablet Take 3.125 mg by mouth 2 (two) times daily.    . dorzolamide-timolol (COSOPT) 22.3-6.8 MG/ML ophthalmic solution Place 1 drop into both eyes 2 (two) times daily.    Marland Kitchen doxazosin (CARDURA)  2 MG tablet Take 2 mg by mouth at bedtime.    Marland Kitchen latanoprost (XALATAN) 0.005 % ophthalmic solution Place 1 drop into both eyes at bedtime.    Marland Kitchen levothyroxine (SYNTHROID, LEVOTHROID) 50 MCG tablet Take 50 mcg by mouth daily.    . mirtazapine (REMERON) 15 MG tablet Take 15 mg by mouth at bedtime.    Marland Kitchen omeprazole (PRILOSEC) 20 MG capsule Take 20 mg by mouth daily.    . tamsulosin (FLOMAX) 0.4 MG CAPS capsule Take 0.4 mg by mouth daily.      Results for orders placed or performed during the hospital encounter of 05/10/16 (from the past 48 hour(s))  CBC     Status: Abnormal   Collection Time: 05/10/16  1:08 AM  Result Value Ref Range   WBC 8.3 3.8 - 10.6 K/uL   RBC 4.53 4.40 - 5.90 MIL/uL   Hemoglobin 13.3 13.0 - 18.0 g/dL   HCT 38.4 (L) 40.0 - 52.0 %   MCV 84.8 80.0 - 100.0 fL   MCH 29.3 26.0 - 34.0 pg   MCHC 34.6 32.0 - 36.0 g/dL   RDW 14.6 (H) 11.5 - 14.5 %   Platelets 178 150 - 440 K/uL  Basic metabolic panel     Status: Abnormal   Collection Time: 05/10/16  1:08 AM  Result Value Ref Range   Sodium 141 135 - 145 mmol/L   Potassium 3.5  3.5 - 5.1 mmol/L   Chloride 104 101 - 111 mmol/L   CO2 26 22 - 32 mmol/L   Glucose, Bld 131 (H) 65 - 99 mg/dL   BUN 16 6 - 20 mg/dL   Creatinine, Ser 1.11 0.61 - 1.24 mg/dL   Calcium 8.9 8.9 - 10.3 mg/dL   GFR calc non Af Amer 56 (L) >60 mL/min   GFR calc Af Amer >60 >60 mL/min    Comment: (NOTE) The eGFR has been calculated using the CKD EPI equation. This calculation has not been validated in all clinical situations. eGFR's persistently <60 mL/min signify possible Chronic Kidney Disease.    Anion gap 11 5 - 15  Troponin I     Status: Abnormal   Collection Time: 05/10/16  1:08 AM  Result Value Ref Range   Troponin I 14.59 (HH) <0.03 ng/mL    Comment: CRITICAL RESULT CALLED TO, READ BACK BY AND VERIFIED WITH KAILEY WALKER @ 0210 ON 05/10/2016 BY [facility]   Blood gas, venous     Status: None   Collection Time: 05/10/16  1:08 AM  Result Value Ref  Range   pH, Ven 7.39 7.250 - 7.430   pCO2, Ven 45 44.0 - 60.0 mmHg   pO2, Ven 32.0 32.0 - 45.0 mmHg   Bicarbonate 27.2 20.0 - 28.0 mmol/L   Acid-Base Excess 1.8 0.0 - 2.0 mmol/L   O2 Saturation 60.9 %   Patient temperature 37.0    Collection site VEIN    Sample type VENOUS   Brain natriuretic peptide     Status: Abnormal   Collection Time: 05/10/16  1:08 AM  Result Value Ref Range   B Natriuretic Peptide 369.0 (H) 0.0 - 100.0 pg/mL  Influenza panel by PCR (type A & B)     Status: Abnormal   Collection Time: 05/10/16  1:08 AM  Result Value Ref Range   Influenza A By PCR POSITIVE (A) NEGATIVE   Influenza B By PCR NEGATIVE NEGATIVE    Comment: (NOTE) The Xpert Xpress Flu assay is intended as an aid in the diagnosis of  influenza and should not be used as a sole basis for treatment.  This  assay is FDA approved for nasopharyngeal swab specimens only. Nasal  washings and aspirates are unacceptable for Xpert Xpress Flu testing.   APTT     Status: None   Collection Time: 05/10/16  4:10 AM  Result Value Ref Range   aPTT 35 24 - 36 seconds  Protime-INR     Status: None   Collection Time: 05/10/16  4:10 AM  Result Value Ref Range   Prothrombin Time 13.6 11.4 - 15.2 seconds   INR 1.04   Troponin I     Status: Abnormal   Collection Time: 05/10/16  5:44 AM  Result Value Ref Range   Troponin I 12.09 (HH) <0.03 ng/mL    Comment: CRITICAL VALUE NOTED. VALUE IS CONSISTENT WITH PREVIOUSLY REPORTED/CALLED VALUE BY [facility]    Dg Chest Portable 1 View  Result Date: 05/10/2016 CLINICAL DATA:  Shortness of breath, progressively worsening over 2 days. Wheezing. Hypertension. Former smoker. EXAM: PORTABLE CHEST 1 VIEW COMPARISON:  11/30/2012 FINDINGS: Shallow inspiration. Mild cardiac enlargement with mild pulmonary vascular congestion. Suggestion of mild interstitial edema at the bases. No blunting of costophrenic angles. No pneumothorax. Calcified and tortuous aorta. IMPRESSION: Mild congestive  changes with slight interstitial edema in the bases. Electronically Signed   By: Lucienne Capers M.D.   On: 05/10/2016 02:11  Review of Systems  Constitutional: Negative for chills and fever.  HENT: Negative for sore throat and tinnitus.   Eyes: Negative for blurred vision and redness.  Respiratory: Positive for shortness of breath. Negative for cough.   Cardiovascular: Positive for chest pain. Negative for palpitations, orthopnea and PND.  Gastrointestinal: Negative for abdominal pain, diarrhea, nausea and vomiting.  Genitourinary: Negative for dysuria, frequency and urgency.  Musculoskeletal: Negative for joint pain and myalgias.  Skin: Negative for rash.       No lesions  Neurological: Positive for weakness. Negative for speech change and focal weakness.  Endo/Heme/Allergies: Does not bruise/bleed easily.       No temperature intolerance  Psychiatric/Behavioral: Negative for depression and suicidal ideas.    Blood pressure 136/68, pulse 93, temperature 98.6 F (37 C), temperature source Oral, resp. rate 19, height _0  (1.702 m), weight 73.8 kg (162 lb 11.2 oz), SpO2 95 %. Physical Exam  Vitals reviewed. Constitutional: He is oriented to person, place, and time. He appears well-developed and well-nourished. No distress.  HENT:  Head: Normocephalic and atraumatic.  Mouth/Throat: Oropharynx is clear and moist.  Eyes: Conjunctivae and EOM are normal. Pupils are equal, round, and reactive to light. No scleral icterus.  Neck: Normal range of motion. Neck supple. No JVD present. No tracheal deviation present. No thyromegaly present.  Cardiovascular: Normal rate, regular rhythm and normal heart sounds.  Exam reveals no gallop and no friction rub.   No murmur heard. Respiratory: Effort normal and breath sounds normal. No respiratory distress.  GI: Soft. Bowel sounds are normal. He exhibits no distension. There is no tenderness.  Genitourinary:  Genitourinary Comments: Deferred   Musculoskeletal: Normal range of motion. He exhibits no edema.  Lymphadenopathy:    He has no cervical adenopathy.  Neurological: He is alert and oriented to person, place, and time. No cranial nerve deficit.  Skin: Skin is warm and dry. No rash noted. No erythema.  Psychiatric: He has a normal mood and affect. His behavior is normal. Judgment and thought content normal.     Assessment/Plan This is a 83 year old male admitted for NSTEMI. 1. NSTEMI: Chest pain with elevated troponin without EKG changes. Continue therapeutic heparin. Follow troponin and consult cardiology. The patient is on cardiac monitor. 2. Influenza: Type A; no definite pneumonia or immune complications. Tamiflu per renal dosing. 3. Pulmonary edema: Infarct has caused decrease in cardiac output. The patient is breathing more comfortably after Lasix. Obtain echocardiogram to evaluate cardiac motion. 4. Hypertension: Controlled; continue Coreg and amlodipine 5. Hypothyroidism: Continue Synthroid. Check TSH 6. Hyperlipidemia: Continue statin therapy 7. Glaucoma: Continue eyedrops 8. Depression/failure to thrive: Continue Remeron 9. BPH: Continue Cardura and Flomax 10. DVT prophylaxis: Full dose anticoagulation 11. GI prophylaxis: PPI per home regimen The patient is a full code. Time spent on admission orders and patient care approximately 45 minutes   Harrie Foreman, MD 05/10/2016, 7:25 AM

## 2016-05-10 NOTE — Progress Notes (Signed)
Patient is not compliant with holding his cath site with large movement.  Reinforced the need for this behavior and then it would only be for 24 hours post cath.

## 2016-05-11 ENCOUNTER — Encounter: Payer: Self-pay | Admitting: Cardiovascular Disease

## 2016-05-11 LAB — CBC
HEMATOCRIT: 32.9 % — AB (ref 40.0–52.0)
Hemoglobin: 11.5 g/dL — ABNORMAL LOW (ref 13.0–18.0)
MCH: 29.2 pg (ref 26.0–34.0)
MCHC: 34.8 g/dL (ref 32.0–36.0)
MCV: 83.9 fL (ref 80.0–100.0)
Platelets: 183 10*3/uL (ref 150–440)
RBC: 3.93 MIL/uL — AB (ref 4.40–5.90)
RDW: 14.3 % (ref 11.5–14.5)
WBC: 11.6 10*3/uL — AB (ref 3.8–10.6)

## 2016-05-11 LAB — BASIC METABOLIC PANEL
Anion gap: 7 (ref 5–15)
BUN: 22 mg/dL — AB (ref 6–20)
CHLORIDE: 107 mmol/L (ref 101–111)
CO2: 26 mmol/L (ref 22–32)
Calcium: 8 mg/dL — ABNORMAL LOW (ref 8.9–10.3)
Creatinine, Ser: 1.05 mg/dL (ref 0.61–1.24)
GFR calc Af Amer: >60 mL/min (ref >60)
GFR calc non Af Amer: >60 mL/min (ref >60)
Glucose, Bld: 115 mg/dL — ABNORMAL HIGH (ref 65–99)
POTASSIUM: 3.6 mmol/L (ref 3.5–5.1)
Sodium: 140 mmol/L (ref 135–145)

## 2016-05-11 LAB — TROPONIN I
TROPONIN I: 10.42 ng/mL — AB (ref <0.03)
TROPONIN I: 9.6 ng/mL — AB (ref <0.03)

## 2016-05-11 LAB — HEMOGLOBIN A1C
Hgb A1c MFr Bld: 5.9 % — ABNORMAL HIGH (ref 4.8–5.6)
Mean Plasma Glucose: 123 mg/dL

## 2016-05-11 LAB — ECHOCARDIOGRAM COMPLETE
Height: 67 in
Weight: 2603.2 oz

## 2016-05-11 LAB — HEPARIN LEVEL (UNFRACTIONATED)
Heparin Unfractionated: 0.36 IU/mL (ref 0.30–0.70)
Heparin Unfractionated: 0.42 IU/mL (ref 0.30–0.70)

## 2016-05-11 MED ORDER — ENOXAPARIN SODIUM 40 MG/0.4ML ~~LOC~~ SOLN
40.0000 mg | SUBCUTANEOUS | Status: DC
  Administered 2016-05-11: 40 mg via SUBCUTANEOUS
  Filled 2016-05-11: qty 0.4

## 2016-05-11 MED ORDER — ASPIRIN EC 81 MG PO TBEC
81.0000 mg | DELAYED_RELEASE_TABLET | Freq: Every day | ORAL | Status: DC
  Administered 2016-05-11 – 2016-05-12 (×2): 81 mg via ORAL
  Filled 2016-05-11 (×2): qty 1

## 2016-05-11 MED ORDER — ISOSORBIDE MONONITRATE ER 30 MG PO TB24
30.0000 mg | ORAL_TABLET | Freq: Every day | ORAL | Status: DC
  Administered 2016-05-11 – 2016-05-12 (×2): 30 mg via ORAL
  Filled 2016-05-11 (×2): qty 1

## 2016-05-11 MED ORDER — RANOLAZINE ER 500 MG PO TB12
500.0000 mg | ORAL_TABLET | Freq: Two times a day (BID) | ORAL | Status: DC
  Administered 2016-05-11 – 2016-05-12 (×3): 500 mg via ORAL
  Filled 2016-05-11 (×3): qty 1

## 2016-05-11 NOTE — Plan of Care (Signed)
Problem: Physical Regulation: Goal: Ability to maintain clinical measurements within normal limits will improve Outcome: Progressing O2 sats are excellent on 1-2 L [place], he remains on 3L [place]  For good oxygenation to heart.  Problem: Activity: Goal: Risk for activity intolerance will decrease Outcome: Progressing Active in room. Standby assistance only to the bathroom.  Steady and stable when he walks.

## 2016-05-11 NOTE — Evaluation (Signed)
Physical Therapy Evaluation Patient Details Name: Jorge Wilson MRN: [id number] DOB: 09/02/1923 Today's Date: 05/11/2016   History of Present Illness  The patient with past medical history of hypertension and hyperlipidemia presents to the emergency department due to shortness of breath.  Pt with  new NSTEMI- troponins are significantly elevated, positive for influenza, cardiology results: Critical high-grade lesion in proximal LAD and proximal left circumflex more than 90% with normal left reticular she ejection fraction on echocardiogram and cardiac catheterization.     Clinical Impression  Pt presents to PT with with generalized weakness and deconditioning and would benefit from acute PT services to address objective findings.  Gait distance limited this date due to isolation precautions.  Pt was able to transfer out of bed to bathroom and perform peri care and hand washing with supervision.  Pt amb in room to recliner and able to perform sit<>stand from recliner safely with good hand placement.  Pt fatigued after this limited exertion and believe he will need assistance at home.  Recommend f/u PT services 3x/week post acute.      Follow Up Recommendations Home health PT    Equipment Recommendations       Recommendations for Other Services       Precautions / Restrictions Precautions Precautions: Other (comment) Precaution Comments: isolation, droplet      Mobility  Bed Mobility Overal bed mobility: Modified Independent             General bed mobility comments: Easily rises to sit EOB, uses bed rails, HOB elevated  Transfers Overall transfer level: Modified independent Equipment used: None             General transfer comment: Sit<>stand from commode, bed, and recliner with supervision assist, good balance upon standing  Ambulation/Gait Ambulation/Gait assistance: Supervision Ambulation Distance (Feet): 20 Feet Assistive device: None Gait  Pattern/deviations: WFL(Within Functional Limits)     General Gait Details: Amb in room due to contact precautions for flu, pt able to negotiate around furniture without difficulty  Stairs            Wheelchair Mobility    Modified Rankin (Stroke Patients Only)       Balance Overall balance assessment: Modified Independent                                           Pertinent Vitals/Pain Pain Assessment: No/denies pain    Home Living Family/patient expects to be discharged to:: Private residence Living Arrangements: Spouse/significant other;Children (Spouse unable to assist with pt care, son also with disabili) Available Help at Discharge: Family;Neighbor;Available PRN/intermittently Type of Home: House Home Access: Stairs to enter Entrance Stairs-Rails: Can reach both Entrance Stairs-Number of Steps: 2-3 Home Layout: One level Home Equipment: Walker - 2 wheels;Cane - single point;Shower seat      Prior Function Level of Independence: Needs assistance   Gait / Transfers Assistance Needed: has RW if needed  ADL's / Homemaking Assistance Needed: cooks and cleans some, takes own bath; has aide that assists with wife, neighbors take care of lawn and driving to appointment        Hand Dominance        Extremity/Trunk Assessment   Upper Extremity Assessment Upper Extremity Assessment: Overall WFL for tasks assessed    Lower Extremity Assessment Lower Extremity Assessment: Overall WFL for tasks assessed       Communication  Communication: No difficulties  Cognition Arousal/Alertness: Awake/alert Behavior During Therapy: WFL for tasks assessed/performed Overall Cognitive Status: Within Functional Limits for tasks assessed                      General Comments      Exercises Other Exercises Other Exercises: General seated theres:  AP, QS, GS, SLR, ABD/ADD and LAQ x 10 reps each    Assessment/Plan    PT Assessment Patient  needs continued PT services  PT Problem List Decreased activity tolerance;Decreased strength;Decreased mobility;Cardiopulmonary status limiting activity          PT Treatment Interventions Gait training;Stair training;Functional mobility training;Therapeutic activities;Therapeutic exercise;Balance training    PT Goals (Current goals can be found in the Care Plan section)  Acute Rehab PT Goals Patient Stated Goal: To go home. PT Goal Formulation: With patient Potential to Achieve Goals: Good    Frequency Min 2X/week   Barriers to discharge Decreased caregiver support      Co-evaluation               End of Session Equipment Utilized During Treatment: Gait belt Activity Tolerance: Patient tolerated treatment well Patient left: in chair;with call bell/phone within reach Nurse Communication: Mobility status         Time: [id number] PT Time Calculation (min) (ACUTE ONLY): 32 min   Charges:   PT Evaluation $PT Eval Moderate Complexity: 1 Procedure     PT G Codes:        Keanu Frickey A Callen Vancuren, PT 05/11/2016, 4:32 PM

## 2016-05-11 NOTE — Care Management (Addendum)
Patient presents from home with chest pain. 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  Patient has elected not to pursue surgery.  He would be in agreement for home health.  have requested palliative consult to address goals of care and code status.  Patient's wife has dementia and had been [facility] but discharged due to stability 2/7.  She is still followed by palliative care.  Patient is in agreement with home health.  [facility] would be agency of choice if meets agency criteria.  Has access to walkers, wheelchair, hospital beds.  Neighbor takes him to MD appointments and errands

## 2016-05-11 NOTE — Progress Notes (Signed)
ANTICOAGULATION CONSULT NOTE - Initial Consult  Pharmacy Consult for heparin Indication: chest pain/ACS  Allergies  Allergen Reactions  . Aspirin Other (See Comments)    Reaction: bleeding ulcer    Patient Measurements: Height: 5\' 7"  (170.2 cm) Weight: 162 lb 11.2 oz (73.8 kg) IBW/kg (Calculated) : 66.1 Heparin Dosing Weight: 78 kg  Vital Signs: Temp: 98.2 F (36.8 C) (02/14 1924) Temp Source: Oral (02/14 1924) BP: 110/55 (02/14 2124) Pulse Rate: 79 (02/14 2124)  Labs:  Recent Labs  05/10/16 0108 05/10/16 0410  05/10/16 0753 05/10/16 1227 05/10/16 1310 05/10/16 1906 05/11/16 0104  HGB 13.3  --   --   --   --  12.3*  --  11.5*  HCT 38.4*  --   --   --   --  35.0*  --  32.9*  PLT 178  --   --   --   --  182  --  183  APTT  --  35  --   --   --   --   --   --   LABPROT  --  13.6  --   --   --  13.9  --   --   INR  --  1.04  --   --   --  1.07  --   --   HEPARINUNFRC  --   --   --   --  0.23*  --   --  0.36  CREATININE 1.11  --   --   --   --  1.09  --  1.05  TROPONINI 14.59*  --   < > 10.84* 10.28*  --  8.11*  --   < > = values in this interval not displayed.  Estimated Creatinine Clearance: 43.7 mL/min (by C-G formula based on SCr of 1.05 mg/dL).   Medical History: Past Medical History:  Diagnosis Date  . Hyperlipidemia   . Hypertension     Medications:  Infusions:  . heparin 1,050 Units/hr (05/10/16 1930)    Assessment: 89 yom cc SOB with positive troponin in ED. Pharmacy consulted to dose heparin for ACS. No PTA OAC noted.   Goal of Therapy:  Heparin level 0.3-0.7 units/ml Monitor platelets by anticoagulation protocol: Yes   Plan:  Pt went to cath lab around 1300. Drip was resumed around 1600 per RN @ 9.595ml/hr/ Since pt was subtherapeutic 0.23 at that rate, will increase rate to 10.685ml/hr. Recheck in 8 hours  2/15 0104 HL therapeutic x 1. Continue current rate. Will recheck HL in 8 hours.  Carola FrostNathan A Cashton Hosley, Pharm.D., BCPS Clinical  Pharmacist 05/11/2016,2:53 AM

## 2016-05-11 NOTE — Progress Notes (Signed)
[facility] - [place] [facility]   PATIENT NAME: Jorge Wilson    MR#:  [id number]  DATE OF BIRTH:  03/08/1924  SUBJECTIVE:  CHIEF COMPLAINT:   Chief Complaint  Patient presents with  . Shortness of Breath   - Feels Weak No further chest pain. Refusing coronary artery bypass grafting and transfer Patient has to take care of his wife and at this time he is not worried about his heart, wants to try only medical management -Flu is positive.   REVIEW OF SYSTEMS:  Review of Systems  Constitutional: Positive for malaise/fatigue. Negative for chills and fever.  HENT: Positive for hearing loss. Negative for congestion, ear discharge and nosebleeds.   Eyes: Negative for blurred vision and double vision.  Respiratory: Positive for cough. Negative for shortness of breath and wheezing.   Cardiovascular: Negative for chest pain, palpitations and leg swelling.  Gastrointestinal: Negative for abdominal pain, constipation, diarrhea, nausea and vomiting.  Genitourinary: Negative for dysuria.  Musculoskeletal: Negative for myalgias.  Neurological: Negative for dizziness, speech change, focal weakness, seizures and headaches.  Psychiatric/Behavioral: Negative for depression.    DRUG ALLERGIES:   Allergies  Allergen Reactions  . Aspirin Other (See Comments)    Reaction: bleeding ulcer    VITALS:  Blood pressure 136/65, pulse 86, temperature 97.5 F (36.4 C), temperature source Oral, resp. rate 16, height 5\' 7"  (1.702 m), weight 73.8 kg (162 lb 11.2 oz), SpO2 99 %.  PHYSICAL EXAMINATION:  Physical Exam  GENERAL:  83 y.o.-year-old patient lying in the bed with no acute distress.  EYES: Pupils equal, round, reactive to light and accommodation. No scleral icterus. Extraocular muscles intact.  HEENT: Head atraumatic, normocephalic. Oropharynx and nasopharynx clear.  NECK:  Supple, no jugular venous distention. No thyroid enlargement, no tenderness.  LUNGS: Scattered  rhonchi bilaterally, no wheezing, rales or crepitation. No use of accessory muscles of respiration.  CARDIOVASCULAR: S1, S2 normal. No rubs, or gallops. 2/6 systolic murmur is present ABDOMEN: Soft, nontender, nondistended. Bowel sounds present. No organomegaly or mass.  EXTREMITIES: No pedal edema, cyanosis, or clubbing.  NEUROLOGIC: Cranial nerves II through XII are intact. Muscle strength 5/5 in all extremities. Sensation intact. Gait not checked. Global Weakness noted PSYCHIATRIC: The patient is alert and oriented x 3.  SKIN: No obvious rash, lesion, or ulcer.    LABORATORY PANEL:   CBC  Recent Labs Lab 05/11/16 0104  WBC 11.6*  HGB 11.5*  HCT 32.9*  PLT 183   ------------------------------------------------------------------------------------------------------------------  Chemistries   Recent Labs Lab 05/11/16 0104  NA 140  K 3.6  CL 107  CO2 26  GLUCOSE 115*  BUN 22*  CREATININE 1.05  CALCIUM 8.0*   ------------------------------------------------------------------------------------------------------------------  Cardiac Enzymes  Recent Labs Lab 05/10/16 1906  TROPONINI 8.11*   ------------------------------------------------------------------------------------------------------------------  RADIOLOGY:  Dg Chest Portable 1 View  Result Date: 05/10/2016 CLINICAL DATA:  Shortness of breath, progressively worsening over 2 days. Wheezing. Hypertension. Former smoker. EXAM: PORTABLE CHEST 1 VIEW COMPARISON:  11/30/2012 FINDINGS: Shallow inspiration. Mild cardiac enlargement with mild pulmonary vascular congestion. Suggestion of mild interstitial edema at the bases. No blunting of costophrenic angles. No pneumothorax. Calcified and tortuous aorta. IMPRESSION: Mild congestive changes with slight interstitial edema in the bases. Electronically Signed   By: Burman NievesWilliam  Stevens M.D.   On: 05/10/2016 02:11    EKG:   Orders placed or performed during the hospital  encounter of 05/10/16  . EKG 12-Lead  . EKG 12-Lead    ASSESSMENT AND  PLAN:   83 year old male who stays at home with his 5, past medical history of hypertension and hyperlipidemia presents to the hospital secondary to dyspnea, cough and chest pain.  #1 NSTEMI- troponins are significantly elevated. -Currently chest pain-free. -Patient is refusing cardiac catheterization and CABG as per my discussion with the patient and Dr. Welton Flakes - Not true allergy to aspirin, cardiology has recommended to start the patient on aspirin 81 mg enteric-coated with Protonix 40 mg once daily -Started patient on Imdur 30 mg once daily and Ranexa 500 mg twice a day -High intensity statin Lipitor 40 mg once daily - Appreciate cardiology consult. Outpatient follow-up with Dr. Welton Flakes on February 20 at 1 PM -Echocardiogram with left ventral ejection fraction 65%  #2 influenza illness-continue Tamiflu. Added cough medications. -Chest x-ray with interstitial edema. One dose of Lasix in emergency room. -Wean O2 as tolerated. Follow-up echocardiogram and add medications if needed.  #3 GERD-on Protonix  #4 depression-on Remeron  #5 hypothyroidism-Synthroid  #6 DVT prophylaxis-Lovenox subcutaneous Physical therapy consult prior to discharge    All the records are reviewed and case discussed with Care Management/Social Workerr. Management plans discussed with the patient, family and they are in agreement.  CODE STATUS: Full code  TOTAL TIME TAKING CARE OF THIS PATIENT: 35 minutes.   POSSIBLE D/C IN 1-2 DAYS, DEPENDING ON CLINICAL CONDITION.   Ramonita Lab M.D on 05/11/2016 at 12:46 PM  Between 7am to 6pm - Pager - [phone]  After 6pm go to [email or web address] - password [email or web address]  [facility]  Office  [phone]  CC: Primary care physician; Pcp Not In System

## 2016-05-11 NOTE — Progress Notes (Signed)
SUBJECTIVE: No chest pain or shortness of breath appears to be comfortable   Vitals:   05/10/16 1623 05/10/16 1924 05/10/16 2124 05/11/16 0300  BP: (!) 115/48 (!) 98/46 (!) 110/55 (!) 108/54  Pulse: 72 80 79 78  Resp: (!) 22 16  18   Temp: 98.1 F (36.7 C) 98.2 F (36.8 C)  98.2 F (36.8 C)  TempSrc: Oral Oral  Oral  SpO2: 95% 98%  97%  Weight:      Height:        Intake/Output Summary (Last 24 hours) at 05/11/16 0814 Last data filed at 05/11/16 0600  Gross per 24 hour  Intake            308.8 ml  Output              200 ml  Net            108.8 ml    LABS: Basic Metabolic Panel:  Recent Labs  16/12/9600/14/18 1310 05/11/16 0104  NA 140 140  K 3.3* 3.6  CL 106 107  CO2 26 26  GLUCOSE 170* 115*  BUN 19 22*  CREATININE 1.09 1.05  CALCIUM 8.4* 8.0*   Liver Function Tests: No results for input(s): AST, ALT, ALKPHOS, BILITOT, PROT, ALBUMIN in the last 72 hours. No results for input(s): LIPASE, AMYLASE in the last 72 hours. CBC:  Recent Labs  05/10/16 1310 05/11/16 0104  WBC 7.2 11.6*  HGB 12.3* 11.5*  HCT 35.0* 32.9*  MCV 85.1 83.9  PLT 182 183   Cardiac Enzymes:  Recent Labs  05/10/16 0753 05/10/16 1227 05/10/16 1906  TROPONINI 10.84* 10.28* 8.11*   BNP: Invalid input(s): POCBNP D-Dimer: No results for input(s): DDIMER in the last 72 hours. Hemoglobin A1C:  Recent Labs  05/10/16 0753  HGBA1C 5.9*   Fasting Lipid Panel: No results for input(s): CHOL, HDL, LDLCALC, TRIG, CHOLHDL, LDLDIRECT in the last 72 hours. Thyroid Function Tests:  Recent Labs  05/10/16 0753  TSH 1.393   Anemia Panel: No results for input(s): VITAMINB12, FOLATE, FERRITIN, TIBC, IRON, RETICCTPCT in the last 72 hours.   PHYSICAL EXAM General: Well developed, well nourished, in no acute distress HEENT:  Normocephalic and atramatic Neck:  No JVD.  Lungs: Clear bilaterally to auscultation and percussion. Heart: HRRR . Normal S1 and S2 without gallops or murmurs.   Abdomen: Bowel sounds are positive, abdomen soft and non-tender  Msk:  Back normal, normal gait. Normal strength and tone for age. Extremities: No clubbing, cyanosis or edema.   Neuro: Alert and oriented X 3. 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 Patient was advised CABG and Dr. Justice RocherWeintrit CT surgery was consulted and can be transferred Friday for CABG next week. However patient this morning is reluctant to go and go through surgery. The patient doesn't want surgery he can be placed on aspirin nitrates and Ranexa and high-dose statins and can be followed up with me Tuesday at 1 PM in the office.  Active Problems:   NSTEMI (non-ST elevated myocardial infarction) (HCC)    Adrian BlackwaterKHAN,Jamice Carreno A, MD, [facility] State G[facility] AssociatesFACC 05/11/2016 8:14 AM

## 2016-05-11 NOTE — Progress Notes (Signed)
He had a liquid stool last evening. Had another just now.  Both seem large in volume.  Will try to measure next occurrence.  Dr. Amado CoeGouru notified

## 2016-05-11 NOTE — Progress Notes (Signed)
Phone changed to a volume control phone.  Patient now able to talk with his family.

## 2016-05-12 DIAGNOSIS — J810 Acute pulmonary edema: Secondary | ICD-10-CM

## 2016-05-12 DIAGNOSIS — Z66 Do not resuscitate: Secondary | ICD-10-CM

## 2016-05-12 DIAGNOSIS — I214 Non-ST elevation (NSTEMI) myocardial infarction: Principal | ICD-10-CM

## 2016-05-12 DIAGNOSIS — Z7189 Other specified counseling: Secondary | ICD-10-CM

## 2016-05-12 DIAGNOSIS — Z515 Encounter for palliative care: Secondary | ICD-10-CM

## 2016-05-12 LAB — BASIC METABOLIC PANEL
Anion gap: 10 (ref 5–15)
BUN: 25 mg/dL — ABNORMAL HIGH (ref 6–20)
CALCIUM: 8.2 mg/dL — AB (ref 8.9–10.3)
CO2: 26 mmol/L (ref 22–32)
CREATININE: 1.12 mg/dL (ref 0.61–1.24)
Chloride: 104 mmol/L (ref 101–111)
GFR calc Af Amer: >60 mL/min (ref >60)
GFR, EST NON AFRICAN AMERICAN: 56 mL/min — AB (ref >60)
Glucose, Bld: 110 mg/dL — ABNORMAL HIGH (ref 65–99)
Potassium: 3.4 mmol/L — ABNORMAL LOW (ref 3.5–5.1)
Sodium: 140 mmol/L (ref 135–145)

## 2016-05-12 LAB — CBC
HEMATOCRIT: 30.3 % — AB (ref 40.0–52.0)
Hemoglobin: 10.7 g/dL — ABNORMAL LOW (ref 13.0–18.0)
MCH: 29.8 pg (ref 26.0–34.0)
MCHC: 35.4 g/dL (ref 32.0–36.0)
MCV: 84 fL (ref 80.0–100.0)
PLATELETS: 173 10*3/uL (ref 150–440)
RBC: 3.61 MIL/uL — ABNORMAL LOW (ref 4.40–5.90)
RDW: 14.2 % (ref 11.5–14.5)
WBC: 8.3 10*3/uL (ref 3.8–10.6)

## 2016-05-12 LAB — TROPONIN I: TROPONIN I: 9.82 ng/mL — AB (ref <0.03)

## 2016-05-12 MED ORDER — OSELTAMIVIR PHOSPHATE 30 MG PO CAPS: 30.0000 mg | ORAL_CAPSULE | Freq: Two times a day (BID) | ORAL | 0 refills | Status: AC

## 2016-05-12 MED ORDER — ISOSORBIDE MONONITRATE ER 30 MG PO TB24: 30.0000 mg | ORAL_TABLET | Freq: Every day | ORAL | 0 refills | Status: AC

## 2016-05-12 MED ORDER — RANOLAZINE ER 500 MG PO TB12: 500.0000 mg | ORAL_TABLET | Freq: Two times a day (BID) | ORAL | 0 refills | Status: AC

## 2016-05-12 MED ORDER — PANTOPRAZOLE SODIUM 40 MG PO TBEC: 40.0000 mg | DELAYED_RELEASE_TABLET | Freq: Every day | ORAL | 0 refills | Status: AC

## 2016-05-12 MED ORDER — ATORVASTATIN CALCIUM 40 MG PO TABS: 40.0000 mg | ORAL_TABLET | Freq: Every day | ORAL | 0 refills | Status: AC

## 2016-05-12 MED ORDER — DOCUSATE SODIUM 100 MG PO CAPS: 100.0000 mg | ORAL_CAPSULE | Freq: Two times a day (BID) | ORAL | 0 refills | Status: AC | PRN

## 2016-05-12 MED ORDER — ASPIRIN 81 MG PO TBEC: 81.0000 mg | DELAYED_RELEASE_TABLET | Freq: Every day | ORAL | Status: AC

## 2016-05-12 MED ORDER — BENZONATATE 100 MG PO CAPS: 100.0000 mg | ORAL_CAPSULE | Freq: Three times a day (TID) | ORAL | 0 refills | Status: AC | PRN

## 2016-05-12 MED ORDER — ACETAMINOPHEN 325 MG PO TABS: 650.0000 mg | ORAL_TABLET | Freq: Four times a day (QID) | ORAL | Status: AC | PRN

## 2016-05-12 MED FILL — Dorzolamide HCl-Timolol Maleate PF Ophth Soln 2-0.5%: OPHTHALMIC | Qty: 1 | Status: AC

## 2016-05-12 NOTE — Progress Notes (Signed)
Discharge instructions given. IV and tele removed. Prescriptions given to patient as well as education on new meds. Reviewed discharge instructions on chest pain and care after for cardiac cath. Patient verbalized understanding.

## 2016-05-12 NOTE — Care Management (Signed)
Heads up referral to [facility]  for SN PT and outpatient palliative care.  Informed attending.  Updated [facility]

## 2016-05-12 NOTE — Care Management (Signed)
Did not qualify for home 02.  

## 2016-05-12 NOTE — Care Management Important Message (Signed)
Important Message  Patient Details  Name: Jorge Wilson E Nyman MRN: [id number] Date of Birth: 01/28/1924   Medicare Important Message Given:  Yes Initial signed IM printed from Epic and given to patient.    Eber HongGreene, Madalynn Pickelsimer R, RN 05/12/2016, 8:29 AM

## 2016-05-12 NOTE — Progress Notes (Signed)
[facility] referral received from [facility] for services of Skilled Nursing, Physical therapy and Palliaitve. Mr. Muto is a 83 year old man with past medical history of HTN, and HLD admitted to [facility] on  05/10/2016 with shortness of breath and chest tightness. In the ED, he was found to have a troponin of 14.5 and chest xray with pulmonary edema and tested positive for the Flu. He was started on a heparin infusion and given lasix, solu-medrol, and neb treatments. Evaluated by cardiology and had a cardiac cath on 2/14. Per chart note review patient has a critical high-grade lesion in the proximal LAD and proximal left circumflex more than 90%. Patient needs a CABG but has decided not to pursue any further treatment. Will be managed conservatively with medications and followed by Dr. Humphrey Rolls as an outpatient. Palliative Medicine was consulted for goals of care/advanced directives.  Writer met in the room with Mr. Kentner, he was alert and oriented and able to answer questions regarding his PCP and his home environment. He lives with his wife, who has dementia and his adult son who is developmentally disabled. He confirmed that his neighbor Wynetta Fines will be bringing him home today. Writer discussed [facility] services and patient was agreeable. Contact information and [facility] brochure left with Mr. Adell. Patient information and F2F and Home heath orders  faxed to [facility] referral. Thank you.  Flo Shanks RN, BSN, [place], hospital Liaison [phone] c

## 2016-05-12 NOTE — Discharge Summary (Signed)
[facility] [place] [facility]   PATIENT NAME: Jorge Wilson    MR#:  [id number]  DATE OF BIRTH:  October 20, 1923  DATE OF ADMISSION:  05/10/2016 ADMITTING PHYSICIAN: Arnaldo Natal, MD  DATE OF DISCHARGE: 05/12/16 PRIMARY CARE PHYSICIAN: Pcp Not In System    ADMISSION DIAGNOSIS:  Shortness of breath [R06.02] Acute pulmonary edema (HCC) [J81.0] Bronchitis [J40] Influenza A [J10.1] NSTEMI (non-ST elevated myocardial infarction) (HCC) [I21.4]  DISCHARGE DIAGNOSIS:  Active Problems:   NSTEMI (non-ST elevated myocardial infarction) (HCC)   Acute pulmonary edema (HCC)   Palliative care by specialist   DNR (do not resuscitate)   Goals of care, counseling/discussion   SECONDARY DIAGNOSIS:   Past Medical History:  Diagnosis Date  . Hyperlipidemia   . Hypertension     HOSPITAL COURSE:  HPI: The patient with past medical history of hypertension and hyperlipidemia presents to the emergency department due to shortness of breath. The patient states that his dyspnea lasted for hours and began at rest. Occasionally he would have "a burning feeling" in his chest. The pain did not radiate. He also admits to a cough for the last few days. Eventually was unable to get comfortable and came to the emergency department for evaluation. He was found to have a significantly elevated troponin at 14.5 for which he was promptly started on a heparin drip. Chest x-ray also showed pulmonary edema. He was given Lasix IV as well as breathing treatments and Solu-Medrol prior to the emergency department staff called the hospitalist service for admission.  Hospital course  #1 NSTEMI- troponins are significantly elevated. -Currently chest pain-free. -Patient is refusing cardiac catheterization and CABG as per my discussion with the patient and Dr. Welton Flakes - Not true allergy to aspirin, cardiology has recommended to start the patient on aspirin 81 mg enteric-coated with Protonix 40 mg  once daily -Started patient on Imdur 30 mg once daily and Ranexa 500 mg twice a day -High intensity statin Lipitor 40 mg once daily - Appreciate cardiology consult. Outpatient follow-up with Dr. Welton Flakes on February 20 at 1 PM -Echocardiogram with left ventral ejection fraction 65%  #2 influenza illness-continue Tamiflu. Added cough medications. -Chest x-ray with interstitial edema. One dose of Lasix in emergency room. -Weaned O2  To RA.  #3 GERD-on Protonix  #4 depression-on Remeron  #5 hypothyroidism-Synthroid  #6 DVT prophylaxis-Lovenox subcutaneous  PT is recommending home health PT and RN will arrange palliative care at home CODE STATUS changed to DO NOT RESUSCITATE    DISCHARGE CONDITIONS:    Stable CONSULTS OBTAINED:  Treatment Team:  Laurier Nancy, MD   PROCEDURESNone  DRUG ALLERGIES:   Allergies  Allergen Reactions  . Aspirin Other (See Comments)    Reaction: bleeding ulcer    DISCHARGE MEDICATIONS:   Current Discharge Medication List    START taking these medications   Details  acetaminophen (TYLENOL) 325 MG tablet Take 2 tablets (650 mg total) by mouth every 6 (six) hours as needed for mild pain or headache.    aspirin EC 81 MG EC tablet Take 1 tablet (81 mg total) by mouth daily.    benzonatate (TESSALON) 100 MG capsule Take 1 capsule (100 mg total) by mouth 3 (three) times daily as needed for cough. Qty: 20 capsule, Refills: 0    docusate sodium (COLACE) 100 MG capsule Take 1 capsule (100 mg total) by mouth 2 (two) times daily as needed for mild constipation. Qty: 10 capsule, Refills: 0  isosorbide mononitrate (IMDUR) 30 MG 24 hr tablet Take 1 tablet (30 mg total) by mouth daily. Qty: 30 tablet, Refills: 0    oseltamivir (TAMIFLU) 30 MG capsule Take 1 capsule (30 mg total) by mouth 2 (two) times daily. Qty: 5 capsule, Refills: 0    pantoprazole (PROTONIX) 40 MG tablet Take 1 tablet (40 mg total) by mouth daily before breakfast. Qty: 30  tablet, Refills: 0    ranolazine (RANEXA) 500 MG 12 hr tablet Take 1 tablet (500 mg total) by mouth 2 (two) times daily. Qty: 60 tablet, Refills: 0      CONTINUE these medications which have CHANGED   Details  atorvastatin (LIPITOR) 40 MG tablet Take 1 tablet (40 mg total) by mouth at bedtime. Qty: 30 tablet, Refills: 0      CONTINUE these medications which have NOT CHANGED   Details  amLODipine (NORVASC) 10 MG tablet Take 10 mg by mouth daily.    brimonidine (ALPHAGAN) 0.2 % ophthalmic solution Place 1 drop into both eyes 2 (two) times daily.    carvedilol (COREG) 3.125 MG tablet Take 3.125 mg by mouth 2 (two) times daily.    dorzolamide-timolol (COSOPT) 22.3-6.8 MG/ML ophthalmic solution Place 1 drop into both eyes 2 (two) times daily.    doxazosin (CARDURA) 2 MG tablet Take 2 mg by mouth at bedtime.    latanoprost (XALATAN) 0.005 % ophthalmic solution Place 1 drop into both eyes at bedtime.    levothyroxine (SYNTHROID, LEVOTHROID) 50 MCG tablet Take 50 mcg by mouth daily.    mirtazapine (REMERON) 15 MG tablet Take 15 mg by mouth at bedtime.    tamsulosin (FLOMAX) 0.4 MG CAPS capsule Take 0.4 mg by mouth daily.      STOP taking these medications     omeprazole (PRILOSEC) 20 MG capsule          DISCHARGE INSTRUCTIONS:   Follow-up with primary care physician in a week Follow-up with cardiology Dr. Welton Flakes on February 20 at 1 PM Continue home health PT, RN and palliative care   DIET:  Cardiac diet  DISCHARGE CONDITION:  Stable  ACTIVITY:  Activity as tolerated  OXYGEN:  Home Oxygen: No.   Oxygen Delivery: room air  DISCHARGE LOCATION:  home   If you experience worsening of your admission symptoms, develop shortness of breath, life threatening emergency, suicidal or homicidal thoughts you must seek medical attention immediately by calling 911 or calling your MD immediately  if symptoms less severe.  You Must read complete instructions/literature along  with all the possible adverse reactions/side effects for all the Medicines you take and that have been prescribed to you. Take any new Medicines after you have completely understood and accpet all the possible adverse reactions/side effects.   Please note  You were cared for by a hospitalist during your hospital stay. If you have any questions about your discharge medications or the care you received while you were in the hospital after you are discharged, you can call the unit and asked to speak with the hospitalist on call if the hospitalist that took care of you is not available. Once you are discharged, your primary care physician will handle any further medical issues. Please note that NO REFILLS for any discharge medications will be authorized once you are discharged, as it is imperative that you return to your primary care physician (or establish a relationship with a primary care physician if you do not have one) for your aftercare needs so that they can  reassess your need for medications and monitor your lab values.     Today  Chief Complaint  Patient presents with  . Shortness of Breath   Patient denies any chest pain or shortness of breath. Refusing cardiac surgery wants to go home to take care of his family. He also stated that he doesn't want to die on the operating table as he is already 83 year old  ROS:  CONSTITUTIONAL: Denies fevers, chills. Denies any fatigue, weakness.  EYES: Denies blurry vision, double vision, eye pain. EARS, NOSE, THROAT: Denies tinnitus, ear pain, hearing loss. RESPIRATORY: Denies cough, wheeze, shortness of breath.  CARDIOVASCULAR: Denies chest pain, palpitations, edema.  GASTROINTESTINAL: Denies nausea, vomiting, diarrhea, abdominal pain. Denies bright red blood per rectum. GENITOURINARY: Denies dysuria, hematuria. ENDOCRINE: Denies nocturia or thyroid problems. HEMATOLOGIC AND LYMPHATIC: Denies easy bruising or bleeding. SKIN: Denies rash or  lesion. MUSCULOSKELETAL: Denies pain in neck, back, shoulder, knees, hips or arthritic symptoms.  NEUROLOGIC: Denies paralysis, paresthesias.  PSYCHIATRIC: Denies anxiety or depressive symptoms.   VITAL SIGNS:  Blood pressure 106/62, pulse 85, temperature 98.3 F (36.8 C), temperature source Oral, resp. rate 12, height 5\' 7"  (1.702 m), weight 74.1 kg (163 lb 6.4 oz), SpO2 95 %.  I/O:    Intake/Output Summary (Last 24 hours) at 05/12/16 1516 Last data filed at 05/11/16 1700  Gross per 24 hour  Intake                0 ml  Output                0 ml  Net                0 ml    PHYSICAL EXAMINATION:  GENERAL:  83 y.o.-year-old patient lying in the bed with no acute distress.  EYES: Pupils equal, round, reactive to light and accommodation. No scleral icterus. Extraocular muscles intact.  HEENT: Head atraumatic, normocephalic. Oropharynx and nasopharynx clear.  NECK:  Supple, no jugular venous distention. No thyroid enlargement, no tenderness.  LUNGS: Normal breath sounds bilaterally, no wheezing, rales,rhonchi or crepitation. No use of accessory muscles of respiration.  CARDIOVASCULAR: S1, S2 normal. No murmurs, rubs, or gallops.  ABDOMEN: Soft, non-tender, non-distended. Bowel sounds present. No organomegaly or mass.  EXTREMITIES: No pedal edema, cyanosis, or clubbing.  NEUROLOGIC: Cranial nerves II through XII are intact. Muscle strength 5/5 in all extremities. Sensation intact. Gait not checked.  PSYCHIATRIC: The patient is alert and oriented x 3.  SKIN: No obvious rash, lesion, or ulcer.   DATA REVIEW:   CBC  Recent Labs Lab 05/12/16 0044  WBC 8.3  HGB 10.7*  HCT 30.3*  PLT 173    Chemistries   Recent Labs Lab 05/12/16 0044  NA 140  K 3.4*  CL 104  CO2 26  GLUCOSE 110*  BUN 25*  CREATININE 1.12  CALCIUM 8.2*    Cardiac Enzymes  Recent Labs Lab 05/12/16 0044  TROPONINI 9.82*    Microbiology Results  Results for orders placed or performed in visit  on 11/26/12  Urine culture     Status: None   Collection Time: 11/26/12  4:30 PM  Result Value Ref Range Status   Micro Text Report   Final       SOURCE: CLEAN CATCH    ORGANISM 1                >100,000 CFU/ML PSEUDOMONAS AERUGINOSA   ORGANISM 2                >  100,000 CFU ENTEROCOCCUS FAECALIS   COMMENT                   -   ANTIBIOTIC                    ORG#1    ORG#2     CEFTAZIDIME                   S                  CIPROFLOXACIN                 S        S         GENTAMICIN                    S                  IMIPENEM                      S                  LEVOFLOXACIN                  S        S         AMPICILLIN                             S         LINEZOLID                              S         NITROFURANTOIN                         S         TETRACYCLINE                           R           Culture, blood (single)     Status: None   Collection Time: 11/26/12  5:56 PM  Result Value Ref Range Status   Micro Text Report   Final       COMMENT                   NO GROWTH AEROBICALLY/ANAEROBICALLY IN 5 DAYS   ANTIBIOTIC                                                      Culture, blood (single)     Status: None   Collection Time: 11/26/12  5:56 PM  Result Value Ref Range Status   Micro Text Report   Final       COMMENT                   NO GROWTH AEROBICALLY/ANAEROBICALLY IN 5 DAYS   ANTIBIOTIC  Culture, blood (single)     Status: None   Collection Time: 11/29/12  8:31 PM  Result Value Ref Range Status   Micro Text Report   Final       COMMENT                   NO GROWTH AEROBICALLY/ANAEROBICALLY IN 5 DAYS   ANTIBIOTIC                                                      Culture, blood (single)     Status: None   Collection Time: 11/29/12  8:35 PM  Result Value Ref Range Status   Micro Text Report   Final       COMMENT                   NO GROWTH AEROBICALLY/ANAEROBICALLY IN 5 DAYS    ANTIBIOTIC                                                        RADIOLOGY:  Dg Chest Portable 1 View  Result Date: 05/10/2016 CLINICAL DATA:  Shortness of breath, progressively worsening over 2 days. Wheezing. Hypertension. Former smoker. EXAM: PORTABLE CHEST 1 VIEW COMPARISON:  11/30/2012 FINDINGS: Shallow inspiration. Mild cardiac enlargement with mild pulmonary vascular congestion. Suggestion of mild interstitial edema at the bases. No blunting of costophrenic angles. No pneumothorax. Calcified and tortuous aorta. IMPRESSION: Mild congestive changes with slight interstitial edema in the bases. Electronically Signed   By: Burman NievesWilliam  Stevens M.D.   On: 05/10/2016 02:11    EKG:   Orders placed or performed during the hospital encounter of 05/10/16  . EKG 12-Lead  . EKG 12-Lead      Management plans discussed with the patient, family and they are in agreement.  CODE STATUS:     Code Status Orders        Start     Ordered   05/12/16 1221  Do not attempt resuscitation (DNR)  Continuous    Question Answer Comment  In the event of cardiac or respiratory ARREST Do not call a "code blue"   In the event of cardiac or respiratory ARREST Do not perform Intubation, CPR, defibrillation or ACLS   In the event of cardiac or respiratory ARREST Use medication by any route, position, wound care, and other measures to relive pain and suffering. May use oxygen, suction and manual treatment of airway obstruction as needed for comfort.      05/12/16 1220    Code Status History    Date Active Date Inactive Code Status Order ID Comments User Context   05/10/2016  7:14 AM 05/12/2016 12:20 PM Full Code [id number]  Arnaldo NatalMichael S Diamond, MD Inpatient      TOTAL TIME TAKING CARE OF THIS PATIENT: 45  minutes.   Note: This dictation was prepared with Dragon dictation along with smaller phrase technology. Any transcriptional errors that result from this process are unintentional.   @M[facility] @  on 05/12/2016  at 3:16 PM  Between 7am to 6pm - Pager - [phone]  After 6pm go to [email or web address] - password [email or web address] [facility]  [place]  [facility]  Office  [phone]  CC: Primary care physician; Pcp Not In System

## 2016-05-12 NOTE — Discharge Instructions (Signed)
Follow-up with primary care physician in a week Follow-up with cardiology Dr. Welton FlakesKhan on February 20 at 1 PM Continue home health PT, RN and palliative care

## 2016-05-12 NOTE — Consult Note (Signed)
Consultation Note Date: 05/12/2016   Patient Name: Jorge Wilson  DOB: 01/10/1924  MRN: [id number]  Age / Sex: 83 y.o., male  PCP: Pcp Not In System Referring Physician: Nicholes Mango, MD  Reason for Consultation: Establishing goals of care  HPI/Patient Profile: 83 y.o. male  with past medical history of hypertension and hyperlipidemia admitted on 05/10/2016 with shortness of breath and chest tightness. In ED, found to have troponin of 14.5 and chest xray with pulmonary edema. Started on heparin infusion and given lasix, solu-medrol, and neb treatments. Patient was evaluated by cardiology and had a cardiac cath on 2/14. Patient with critical high-grade lesion in proximal LAD and proximal left circumflex more than 90%. Patient needs a CABG but declining. Will be managed conservatively with medications and followed by Dr. Humphrey Rolls outpatient. Palliative medicine consultation for goals of care/advanced directives.      Clinical Assessment and Goals of Care: I have reviewed medical records, discussed with Dr. Margaretmary Eddy and RN CM, and met with patient at bedside to discuss diagnosis, GOC, EOL wishes, disposition and options.  Introduced Palliative Medicine as specialized medical care for people living with serious illness. It focuses on providing relief from the symptoms and stress of a serious illness. The goal is to improve quality of life for both the patient and the family.  We discussed a brief life review of the patient. He has been married for 41 years. He is the primary caregiver for his wife who had a massive stroke over 30 years ago. Mr. Schreifels also cares for their only child, Cyndie Chime, who is also deconditioned from chronic disease. He was a hardworking individual all of his life and served in the [employer]. Mr. Sangiovanni ambulates independently, able to perform ADL's, and with good appetite. He speaks of supportive  neighbors who are available to drive him to and from appointments.   Discussed current diagnosis and severity of heart disease, requiring heart surgery. Mr. Baena stands firm on his decision to not pursue surgical intervention stating "I may not survive a surgery." He states "I am 67 and have lived a good life." He plans to follow-up with cardiology outpatient.   Advanced directives, concepts specific to code status, and artifical feeding and hydration were discussed. Mr. Shorten would not want to be resuscitated and placed on life support. He wishes to die a natural death and would not want to be kept alive by heroic means. Educated on DNR/DNI status and that durable DNR will be placed in his chart to take home. Encouraged he discuss his EOL wishes with son and neighbors. He speaks of his family burial plot and funeral arrangements. He is at peace with dying and denies any fears or concerns.   Educated on outpatient palliative services to follow at home. He agrees with this and is familiar with palliative/hospice services from his wife.    SUMMARY OF RECOMMENDATIONS    Discussed and educated on code status. Patient wishes for DNR/DNI. Durable DNR placed in chart.   He plans to  follow-up with cardiology next week.   Disposition home with home health and palliative services to follow.   Code Status/Advance Care Planning:  DNR   Symptom Management:   Per attending  Palliative Prophylaxis:   Delirium Protocol  Psycho-social/Spiritual:   Desire for further Chaplaincy support:no  Additional Recommendations: Caregiving  Support/Resources  Prognosis:   Unable to determine  High risk for decompensation with severe heart occlusions. Declining CABG.   Discharge Planning: Home with Palliative Services      Primary Diagnoses: Present on Admission: . NSTEMI (non-ST elevated myocardial infarction) ([place])   I have reviewed the medical record, interviewed the patient and family,  and examined the patient. The following aspects are pertinent.  Past Medical History:  Diagnosis Date  . Hyperlipidemia   . Hypertension    Social History   Social History  . Marital status: Married    Spouse name: N/A  . Number of children: N/A  . Years of education: N/A   Social History Main Topics  . Smoking status: Former Smoker    Quit date: 1988  . Smokeless tobacco: Former Systems developer    Quit date: 1988  . Alcohol use No  . Drug use: Unknown  . Sexual activity: Not Asked   Other Topics Concern  . None   Social History Narrative  . None   Family History  Problem Relation Age of Onset  . Cancer Father     deceased of self-inflicted gunshot wound   Scheduled Meds: . amLODipine  10 mg Oral Daily  . aspirin EC  81 mg Oral Daily  . atorvastatin  40 mg Oral QHS  . benzonatate  100 mg Oral TID  . brimonidine  1 drop Both Eyes BID  . carvedilol  3.125 mg Oral BID WC  . chlorhexidine  15 mL Mouth Rinse BID  . docusate sodium  100 mg Oral BID  . dorzolamidel-timolol  1 drop Both Eyes BID  . doxazosin  2 mg Oral QHS  . enoxaparin (LOVENOX) injection  40 mg Subcutaneous Q24H  . isosorbide mononitrate  30 mg Oral Daily  . latanoprost  1 drop Both Eyes QHS  . levothyroxine  50 mcg Oral Q0600  . mouth rinse  15 mL Mouth Rinse q12n4p  . mirtazapine  15 mg Oral QHS  . oseltamivir  30 mg Oral BID  . pantoprazole  40 mg Oral QAC breakfast  . ranolazine  500 mg Oral BID  . sodium chloride flush  3 mL Intravenous Q12H  . sodium chloride flush  3 mL Intravenous Q12H  . tamsulosin  0.4 mg Oral Daily   Continuous Infusions: PRN Meds:.sodium chloride, acetaminophen, ondansetron **OR** ondansetron (ZOFRAN) IV, ondansetron (ZOFRAN) IV, sodium chloride flush Medications Prior to Admission:  Prior to Admission medications   Medication Sig Start Date End Date Taking? Authorizing Provider  amLODipine (NORVASC) 10 MG tablet Take 10 mg by mouth daily.   Yes Historical Provider, MD    atorvastatin (LIPITOR) 20 MG tablet Take 20 mg by mouth at bedtime.   Yes Historical Provider, MD  brimonidine (ALPHAGAN) 0.2 % ophthalmic solution Place 1 drop into both eyes 2 (two) times daily.   Yes Historical Provider, MD  carvedilol (COREG) 3.125 MG tablet Take 3.125 mg by mouth 2 (two) times daily.   Yes Historical Provider, MD  dorzolamide-timolol (COSOPT) 22.3-6.8 MG/ML ophthalmic solution Place 1 drop into both eyes 2 (two) times daily.   Yes Historical Provider, MD  doxazosin (CARDURA) 2 MG tablet Take  2 mg by mouth at bedtime.   Yes Historical Provider, MD  latanoprost (XALATAN) 0.005 % ophthalmic solution Place 1 drop into both eyes at bedtime.   Yes Historical Provider, MD  levothyroxine (SYNTHROID, LEVOTHROID) 50 MCG tablet Take 50 mcg by mouth daily.   Yes Historical Provider, MD  mirtazapine (REMERON) 15 MG tablet Take 15 mg by mouth at bedtime.   Yes Historical Provider, MD  omeprazole (PRILOSEC) 20 MG capsule Take 20 mg by mouth daily.   Yes Historical Provider, MD  tamsulosin (FLOMAX) 0.4 MG CAPS capsule Take 0.4 mg by mouth daily.   Yes Historical Provider, MD   Allergies  Allergen Reactions  . Aspirin Other (See Comments)    Reaction: bleeding ulcer   Review of Systems  Respiratory: Positive for chest tightness and shortness of breath.     Physical Exam  Constitutional: He is oriented to person, place, and time. He is cooperative.  HENT:  Head: Normocephalic and atraumatic.  Cardiovascular: Regular rhythm and normal heart sounds.   Pulmonary/Chest: Effort normal and breath sounds normal.  Abdominal: Normal appearance.  Neurological: He is alert and oriented to person, place, and time.  Skin: Skin is warm and dry.  Psychiatric: He has a normal mood and affect. His speech is normal and behavior is normal. Cognition and memory are normal.  Nursing note and vitals reviewed.  Vital Signs: BP 106/62 (BP Location: Left Arm)   Pulse 85   Temp 98.3 F (36.8 C) (Oral)    Resp 12   Ht _0  (1.702 m)   Wt 74.1 kg (163 lb 6.4 oz)   SpO2 95%   BMI 25.59 kg/m  Pain Assessment: No/denies pain POSS *See Group Information*: 1-Acceptable,Awake and alert Pain Score: 0-No pain  SpO2: SpO2: 95 % O2 Device:SpO2: 95 % O2 Flow Rate: .O2 Flow Rate (L/min): 2 L/min  IO: Intake/output summary:   Intake/Output Summary (Last 24 hours) at 05/12/16 1224 Last data filed at 05/11/16 1700  Gross per 24 hour  Intake                0 ml  Output                0 ml  Net                0 ml    LBM: Last BM Date: 05/11/16 Baseline Weight: Weight: 78 kg (172 lb) Most recent weight: Weight: 74.1 kg (163 lb 6.4 oz)     Palliative Assessment/Data:  PPS 60%   Flowsheet Rows   Flowsheet Row Most Recent Value  Intake Tab  Referral Department  Hospitalist  Unit at Time of Referral  Cardiac/Telemetry Unit  Palliative Care Primary Diagnosis  Cardiac  Date Notified  05/11/16  Palliative Care Type  New Palliative care  Reason for referral  Clarify Goals of Care  Date of Admission  05/10/16  Date first seen by Palliative Care  05/12/16  # of days IP prior to Palliative referral  1  Clinical Assessment  Palliative Performance Scale Score  60%  Psychosocial & Spiritual Assessment  Palliative Care Outcomes  Patient/Family meeting held?  Yes  Who was at the meeting?  patient  Palliative Care Outcomes  Clarified goals of care, ACP counseling assistance, Changed CPR status, Completed durable DNR, Linked to palliative care logitudinal support, Provided psychosocial or spiritual support      Time In: 1100 Time Out: 1215 Time Total: 83mn Greater than 50%  of  this time was spent counseling and coordinating care related to the above assessment and plan.  Signed by:  Ihor Dow, FNP-C Palliative Medicine Team  Phone: [phone] Fax: [phone]   Please contact Palliative Medicine Team phone at [phone] for questions and concerns.  For individual provider: See  Shea Evans

## 2016-05-12 NOTE — Progress Notes (Signed)
  SUBJECTIVE:Pt denies chest pain or shortness of breath. He is feeling well and anxious to get home.    Vitals:   05/11/16 1821 05/11/16 2042 05/12/16 0420 05/12/16 1212  BP: (!) 131/57 (!) 103/50 (!) 115/57 106/62  Pulse: 90 84 81 85  Resp:  16 18 12   Temp:  98.4 F (36.9 C) 98.8 F (37.1 C) 98.3 F (36.8 C)  TempSrc:  Oral Oral Oral  SpO2:  93% 95% 95%  Weight:   163 lb 6.4 oz (74.1 kg)   Height:        Intake/Output Summary (Last 24 hours) at 05/12/16 1618 Last data filed at 05/11/16 1700  Gross per 24 hour  Intake                0 ml  Output                0 ml  Net                0 ml    LABS: Basic Metabolic Panel:  Recent Labs  16/12/9600/15/18 0104 05/12/16 0044  NA 140 140  K 3.6 3.4*  CL 107 104  CO2 26 26  GLUCOSE 115* 110*  BUN 22* 25*  CREATININE 1.05 1.12  CALCIUM 8.0* 8.2*   Liver Function Tests: No results for input(s): AST, ALT, ALKPHOS, BILITOT, PROT, ALBUMIN in the last 72 hours. No results for input(s): LIPASE, AMYLASE in the last 72 hours. CBC:  Recent Labs  05/11/16 0104 05/12/16 0044  WBC 11.6* 8.3  HGB 11.5* 10.7*  HCT 32.9* 30.3*  MCV 83.9 84.0  PLT 183 173   Cardiac Enzymes:  Recent Labs  05/11/16 1256 05/11/16 1843 05/12/16 0044  TROPONINI 10.42* 9.60* 9.82*   BNP: Invalid input(s): POCBNP D-Dimer: No results for input(s): DDIMER in the last 72 hours. Hemoglobin A1C:  Recent Labs  05/10/16 0753  HGBA1C 5.9*   Fasting Lipid Panel: No results for input(s): CHOL, HDL, LDLCALC, TRIG, CHOLHDL, LDLDIRECT in the last 72 hours. Thyroid Function Tests:  Recent Labs  05/10/16 0753  TSH 1.393   Anemia Panel: No results for input(s): VITAMINB12, FOLATE, FERRITIN, TIBC, IRON, RETICCTPCT in the last 72 hours.   PHYSICAL EXAM General: Well developed, well nourished, in no acute distress HEENT:  Normocephalic and atramatic Neck:  No JVD.  Lungs: Clear bilaterally to auscultation and percussion. Heart: HRRR . Normal S1  and S2 without gallops or murmurs.  Abdomen: Bowel sounds are positive, abdomen soft and non-tender  Msk:  Back normal, normal gait. Normal strength and tone for age. Extremities: No clubbing, cyanosis or edema.   Neuro: Alert and oriented X 3. Psych:  Good affect, responds appropriately  TELEMETRY:Normal sinus rhythm  ASSESSMENT AND PLAN: Discharge home today with palliative care and home oxygen. Pt was agreeable to plan. Will have outpatient follow up next Tuesday with [facility].   Active Problems:   NSTEMI (non-ST elevated myocardial infarction) [facility](HCC)   Acute pulmonary edema [facility](HCC)   Palliative care by specialist   DNR (do not resuscitate)   Goals of care, counseling/discussion    Caroleen HammanKristin Dugan Vanhoesen, NP-C 05/12/2016 4:18 PM

## 2016-05-12 NOTE — Progress Notes (Signed)
SATURATION QUALIFICATIONS: (This note is used to comply with regulatory documentation for home oxygen)  Patient Saturations on Room Air at Rest = 92%  Patient Saturations on Room Air while Ambulating = 91%  Patient Saturations on Liters of oxygen while Ambulating = N/A  Please briefly explain why patient needs home oxygen:

## 2016-05-25 DEATH — deceased
